# Patient Record
Sex: Male | Born: 1985 | Race: Black or African American | Hispanic: No | Marital: Married | Smoking: Never smoker
Health system: Southern US, Community
[De-identification: ages and names within clinical notes are randomized; demographics above are authoritative.]

## PROBLEM LIST (undated history)

## (undated) DIAGNOSIS — K449 Diaphragmatic hernia without obstruction or gangrene: Secondary | ICD-10-CM

## (undated) DIAGNOSIS — K219 Gastro-esophageal reflux disease without esophagitis: Secondary | ICD-10-CM

## (undated) DIAGNOSIS — S86019A Strain of unspecified Achilles tendon, initial encounter: Secondary | ICD-10-CM

## (undated) DIAGNOSIS — F419 Anxiety disorder, unspecified: Secondary | ICD-10-CM

## (undated) DIAGNOSIS — J302 Other seasonal allergic rhinitis: Secondary | ICD-10-CM

## (undated) HISTORY — DX: Gastro-esophageal reflux disease without esophagitis: K21.9

## (undated) HISTORY — DX: Diaphragmatic hernia without obstruction or gangrene: K44.9

## (undated) HISTORY — PX: UPPER GI ENDOSCOPY: SHX6162

---

## 2005-04-23 ENCOUNTER — Emergency Department (HOSPITAL_COMMUNITY): Admission: EM | Admit: 2005-04-23 | Discharge: 2005-04-23 | Payer: Self-pay | Admitting: Emergency Medicine

## 2005-04-30 ENCOUNTER — Emergency Department (HOSPITAL_COMMUNITY): Admission: EM | Admit: 2005-04-30 | Discharge: 2005-04-30 | Payer: Self-pay | Admitting: Emergency Medicine

## 2006-04-24 ENCOUNTER — Emergency Department (HOSPITAL_COMMUNITY): Admission: EM | Admit: 2006-04-24 | Discharge: 2006-04-24 | Payer: Self-pay | Admitting: Emergency Medicine

## 2006-04-28 ENCOUNTER — Emergency Department (HOSPITAL_COMMUNITY): Admission: EM | Admit: 2006-04-28 | Discharge: 2006-04-28 | Payer: Self-pay | Admitting: Emergency Medicine

## 2006-06-09 ENCOUNTER — Emergency Department (HOSPITAL_COMMUNITY): Admission: EM | Admit: 2006-06-09 | Discharge: 2006-06-09 | Payer: Self-pay | Admitting: Emergency Medicine

## 2006-06-20 ENCOUNTER — Emergency Department (HOSPITAL_COMMUNITY): Admission: EM | Admit: 2006-06-20 | Discharge: 2006-06-20 | Payer: Self-pay | Admitting: Emergency Medicine

## 2006-07-06 ENCOUNTER — Emergency Department (HOSPITAL_COMMUNITY): Admission: EM | Admit: 2006-07-06 | Discharge: 2006-07-06 | Payer: Self-pay | Admitting: Emergency Medicine

## 2006-07-10 ENCOUNTER — Ambulatory Visit: Payer: Self-pay | Admitting: Gastroenterology

## 2006-07-11 ENCOUNTER — Ambulatory Visit: Payer: Self-pay | Admitting: Gastroenterology

## 2011-01-20 ENCOUNTER — Inpatient Hospital Stay (INDEPENDENT_AMBULATORY_CARE_PROVIDER_SITE_OTHER)
Admission: RE | Admit: 2011-01-20 | Discharge: 2011-01-20 | Disposition: A | Discharge status: Home / Self Care | Payer: 59 | Source: Ambulatory Visit | Attending: Emergency Medicine | Admitting: Emergency Medicine

## 2011-01-20 DIAGNOSIS — Z760 Encounter for issue of repeat prescription: Secondary | ICD-10-CM

## 2011-04-03 ENCOUNTER — Emergency Department (HOSPITAL_COMMUNITY)
Admission: EM | Admit: 2011-04-03 | Discharge: 2011-04-03 | Disposition: A | Discharge status: Home / Self Care | Payer: 59 | Attending: Family Medicine | Admitting: Family Medicine

## 2011-04-03 DIAGNOSIS — R42 Dizziness and giddiness: Secondary | ICD-10-CM | POA: Insufficient documentation

## 2011-04-03 DIAGNOSIS — F410 Panic disorder [episodic paroxysmal anxiety] without agoraphobia: Secondary | ICD-10-CM | POA: Insufficient documentation

## 2011-04-03 DIAGNOSIS — R0989 Other specified symptoms and signs involving the circulatory and respiratory systems: Secondary | ICD-10-CM | POA: Insufficient documentation

## 2011-04-03 DIAGNOSIS — R5381 Other malaise: Secondary | ICD-10-CM | POA: Insufficient documentation

## 2011-04-03 DIAGNOSIS — R51 Headache: Secondary | ICD-10-CM | POA: Insufficient documentation

## 2011-04-03 DIAGNOSIS — R0609 Other forms of dyspnea: Secondary | ICD-10-CM | POA: Insufficient documentation

## 2011-04-03 DIAGNOSIS — M542 Cervicalgia: Secondary | ICD-10-CM | POA: Insufficient documentation

## 2011-04-03 LAB — POCT I-STAT, CHEM 8
BUN: 16 mg/dL (ref 6–23)
Calcium, Ion: 1.15 mmol/L (ref 1.12–1.32)
Chloride: 100 mEq/L (ref 96–112)
Creatinine, Ser: 1.1 mg/dL (ref 0.50–1.35)
Glucose, Bld: 93 mg/dL (ref 70–99)
HCT: 50 % (ref 39.0–52.0)

## 2011-05-30 ENCOUNTER — Encounter: Payer: Self-pay | Admitting: Emergency Medicine

## 2011-05-30 ENCOUNTER — Other Ambulatory Visit: Payer: Self-pay

## 2011-05-30 ENCOUNTER — Emergency Department (HOSPITAL_COMMUNITY)
Admission: EM | Admit: 2011-05-30 | Discharge: 2011-05-30 | Disposition: A | Discharge status: Home / Self Care | Payer: 59 | Attending: Emergency Medicine | Admitting: Emergency Medicine

## 2011-05-30 DIAGNOSIS — R209 Unspecified disturbances of skin sensation: Secondary | ICD-10-CM | POA: Insufficient documentation

## 2011-05-30 DIAGNOSIS — R079 Chest pain, unspecified: Secondary | ICD-10-CM | POA: Insufficient documentation

## 2011-05-30 DIAGNOSIS — F410 Panic disorder [episodic paroxysmal anxiety] without agoraphobia: Secondary | ICD-10-CM | POA: Insufficient documentation

## 2011-05-30 DIAGNOSIS — F419 Anxiety disorder, unspecified: Secondary | ICD-10-CM

## 2011-05-30 DIAGNOSIS — M542 Cervicalgia: Secondary | ICD-10-CM | POA: Insufficient documentation

## 2011-05-30 DIAGNOSIS — F411 Generalized anxiety disorder: Secondary | ICD-10-CM | POA: Insufficient documentation

## 2011-05-30 DIAGNOSIS — Z79899 Other long term (current) drug therapy: Secondary | ICD-10-CM | POA: Insufficient documentation

## 2011-05-30 MED ORDER — LORAZEPAM 1 MG PO TABS
1.0000 mg | ORAL_TABLET | Freq: Once | ORAL | Status: AC
  Administered 2011-05-30: 1 mg via ORAL
  Filled 2011-05-30: qty 1

## 2011-05-30 MED ORDER — ALPRAZOLAM 0.5 MG PO TABS: 0.5000 mg | ORAL_TABLET | Freq: Every evening | ORAL | Status: AC | PRN

## 2011-05-30 NOTE — ED Notes (Signed)
Pt states that on his way to work this am he began feeling sob, and anxiety, heavy breathing talking in full sentences, states that he feels like it is a panic attack has a hx of this

## 2011-05-30 NOTE — ED Provider Notes (Signed)
History     CSN: [id number] Arrival date & time: 05/30/2011  8:24 AM   First MD Initiated Contact with Patient 05/30/11 [phone]      Chief Complaint  Patient presents with  . Anxiety  . Neck Pain    (Consider location/radiation/quality/duration/timing/severity/associated sxs/prior treatment) Patient is a 25 y.o. male presenting with anxiety and neck pain. The history is provided by the patient. No language interpreter was used.  Anxiety This is a recurrent problem. The current episode started today. The problem occurs rarely. The problem has been unchanged. Associated symptoms include chest pain and neck pain. Pertinent negatives include no congestion or coughing. The symptoms are aggravated by stress. He has tried nothing for the symptoms.  Neck Pain  Associated symptoms include chest pain.   Reports that he was working on the rehab floor at [employer] and suddenly started to breath very fast and both his hands started tingling.  States that this happened once before about a month ago.  States that his Aunt died about 2 weeks ago and that is the stress in his life right now. Other than that he is not sure what could be causing these symptoms.  Feels pressure in his throat/chest and breathing fast.   Past Medical History  Diagnosis Date  . Panic attack     History reviewed. No pertinent past surgical history.  No family history on file.  History  Substance Use Topics  . Smoking status: Never Smoker   . Smokeless tobacco: Not on file  . Alcohol Use: Yes      Review of Systems  HENT: Positive for neck pain. Negative for congestion.   Respiratory: Negative for cough.   Cardiovascular: Positive for chest pain.  All other systems reviewed and are negative.    Allergies  Review of patient's allergies indicates no known allergies.  Home Medications   Current Outpatient Rx  Name Route Sig Dispense Refill  . ALBUTEROL SULFATE (2.5 MG/3ML) 0.083% IN NEBU Nebulization Take  2.5 mg by nebulization every 6 (six) hours as needed.      Marland Kitchen FLUTICASONE-SALMETEROL 115-21 MCG/ACT IN AERO Inhalation Inhale 2 puffs into the lungs 2 (two) times daily.      Marland Kitchen BARD DISPOZ-A-BAG/FLIP-FLO VLV MISC Does not apply by Does not apply route.      Marland Kitchen MONTELUKAST SODIUM 10 MG PO TABS Oral Take 10 mg by mouth at bedtime.        BP 128/71  Pulse 95  Temp(Src) 97.6 F (36.4 C) (Oral)  Resp 18  SpO2 100%  Physical Exam  Nursing note and vitals reviewed. Constitutional: He is oriented to person, place, and time. He appears well-developed and well-nourished.  HENT:  Head: Normocephalic and atraumatic.  Eyes: Pupils are equal, round, and reactive to light.  Neck: Normal range of motion. Neck supple.  Cardiovascular: Normal rate.  Exam reveals no gallop and no friction rub.   Pulmonary/Chest: Breath sounds normal.       hyperventilating  Abdominal: Soft.  Musculoskeletal: Normal range of motion.  Neurological: He is alert and oriented to person, place, and time.  Skin: Skin is warm and dry.  Psychiatric: His mood appears anxious. Thought content is not paranoid and not delusional. Cognition and memory are not impaired. He does not exhibit a depressed mood. He expresses no homicidal and no suicidal ideation. He expresses no suicidal plans and no homicidal plans. He exhibits normal recent memory and normal remote memory.       Anxious  ED Course  Procedures (including critical care time)  Labs Reviewed - No data to display No results found.   No diagnosis found.    MDM          Jethro Bastos, NP 05/30/11 [phone]

## 2011-06-01 NOTE — ED Provider Notes (Signed)
Medical screening examination/treatment/procedure(s) were performed by non-physician practitioner and as supervising physician I was immediately available for consultation/collaboration.   Gwyneth Sprout, MD 06/01/11 2141

## 2011-07-15 ENCOUNTER — Emergency Department (HOSPITAL_BASED_OUTPATIENT_CLINIC_OR_DEPARTMENT_OTHER)
Admission: EM | Admit: 2011-07-15 | Discharge: 2011-07-15 | Disposition: A | Discharge status: Home / Self Care | Payer: 59 | Attending: Emergency Medicine | Admitting: Emergency Medicine

## 2011-07-15 ENCOUNTER — Ambulatory Visit (INDEPENDENT_AMBULATORY_CARE_PROVIDER_SITE_OTHER): Payer: 59

## 2011-07-15 ENCOUNTER — Emergency Department (INDEPENDENT_AMBULATORY_CARE_PROVIDER_SITE_OTHER): Payer: 59

## 2011-07-15 ENCOUNTER — Encounter (HOSPITAL_BASED_OUTPATIENT_CLINIC_OR_DEPARTMENT_OTHER): Payer: Self-pay | Admitting: Emergency Medicine

## 2011-07-15 DIAGNOSIS — R51 Headache: Secondary | ICD-10-CM

## 2011-07-15 DIAGNOSIS — R0602 Shortness of breath: Secondary | ICD-10-CM

## 2011-07-15 DIAGNOSIS — J45909 Unspecified asthma, uncomplicated: Secondary | ICD-10-CM | POA: Insufficient documentation

## 2011-07-15 DIAGNOSIS — R110 Nausea: Secondary | ICD-10-CM

## 2011-07-15 MED ORDER — HYDROMORPHONE HCL PF 1 MG/ML IJ SOLN
1.0000 mg | Freq: Once | INTRAMUSCULAR | Status: AC
  Administered 2011-07-15: 1 mg via INTRAVENOUS
  Filled 2011-07-15: qty 1

## 2011-07-15 MED ORDER — SODIUM CHLORIDE 0.9 % IV BOLUS (SEPSIS)
1000.0000 mL | Freq: Once | INTRAVENOUS | Status: AC
  Administered 2011-07-15: 1000 mL via INTRAVENOUS

## 2011-07-15 MED ORDER — ONDANSETRON HCL 4 MG/2ML IJ SOLN
4.0000 mg | Freq: Once | INTRAMUSCULAR | Status: AC
  Administered 2011-07-15: 4 mg via INTRAVENOUS
  Filled 2011-07-15: qty 2

## 2011-07-15 MED ORDER — DIPHENHYDRAMINE HCL 50 MG/ML IJ SOLN
50.0000 mg | Freq: Once | INTRAMUSCULAR | Status: AC
  Administered 2011-07-15: 50 mg via INTRAVENOUS
  Filled 2011-07-15: qty 1

## 2011-07-15 MED ORDER — METHYLPREDNISOLONE SODIUM SUCC 125 MG IJ SOLR
125.0000 mg | Freq: Once | INTRAMUSCULAR | Status: AC
  Administered 2011-07-15: 125 mg via INTRAVENOUS
  Filled 2011-07-15: qty 2

## 2011-07-15 NOTE — ED Provider Notes (Signed)
History     CSN: [id number]  Arrival date & time 07/15/11  1230   First MD Initiated Contact with Patient 07/15/11 1314      Chief Complaint  Patient presents with  . Headache    Headache with nausea and SOB    (Consider location/radiation/quality/duration/timing/severity/associated sxs/prior treatment) HPI Comments: No h/o headaches.  Patient is a 25 y.o. male presenting with headaches. The history is provided by the patient. No language interpreter was used.  Headache  This is a new problem. Episode onset: ~ 1 week ago. The problem occurs constantly. The problem has not changed since onset.The headache is associated with bright light and loud noise. The pain is located in the temporal region. The pain is moderate. The pain does not radiate. Associated symptoms include nausea. Pertinent negatives include no anorexia, no fever, no malaise/fatigue, no near-syncope and no vomiting. He has tried nothing for the symptoms.    Past Medical History  Diagnosis Date  . Panic attack   . Asthma     Past Surgical History  Procedure Date  . Hiatal hernia repair     History reviewed. No pertinent family history.  History  Substance Use Topics  . Smoking status: Never Smoker   . Smokeless tobacco: Not on file  . Alcohol Use: Yes      Review of Systems  Constitutional: Negative for fever and malaise/fatigue.  Cardiovascular: Negative for near-syncope.  Gastrointestinal: Positive for nausea. Negative for vomiting and anorexia.  Neurological: Positive for headaches.    Allergies  Review of patient's allergies indicates no known allergies.  Home Medications   Current Outpatient Rx  Name Route Sig Dispense Refill  . SUMATRIPTAN SUCCINATE 100 MG PO TABS Oral Take 100 mg by mouth every 2 (two) hours as needed.      . ALBUTEROL SULFATE HFA 108 (90 BASE) MCG/ACT IN AERS Inhalation Inhale 2 puffs into the lungs every 6 (six) hours as needed. For shortness of breath     .  FLUTICASONE PROPIONATE 50 MCG/ACT NA SUSP Nasal Place 2 sprays into the nose daily.      Marland Kitchen FLUTICASONE-SALMETEROL 115-21 MCG/ACT IN AERO Inhalation Inhale 1 puff into the lungs 2 (two) times daily.     Marland Kitchen MONTELUKAST SODIUM 10 MG PO TABS Oral Take 10 mg by mouth at bedtime.       BP 131/67  Pulse 66  Temp 98 F (36.7 C)  Resp 20  SpO2 99%  Physical Exam  Nursing note and vitals reviewed. Constitutional: He is oriented to person, place, and time. He appears well-developed and well-nourished. He is cooperative. No distress.  HENT:  Head: Normocephalic and atraumatic. Head is without raccoon's eyes, without Battle's sign, without abrasion, without contusion and without laceration.    Eyes: EOM are normal. Pupils are equal, round, and reactive to light.  Neck: Normal range of motion. Neck supple.  Cardiovascular: Normal rate, regular rhythm, normal heart sounds and intact distal pulses.   Pulmonary/Chest: Effort normal and breath sounds normal. No respiratory distress. He has no wheezes. He has no rales. He exhibits no tenderness.  Abdominal: Soft. He exhibits no distension. There is no tenderness.  Musculoskeletal: Normal range of motion.  Lymphadenopathy:    He has no cervical adenopathy.  Neurological: He is alert and oriented to person, place, and time. He has normal strength. No cranial nerve deficit or sensory deficit. GCS eye subscore is 4. GCS verbal subscore is 5. GCS motor subscore is 6.  Skin: Skin  is warm and dry. He is not diaphoretic.  Psychiatric: He has a normal mood and affect. Judgment normal.    ED Course  Procedures (including critical care time)  Labs Reviewed - No data to display No results found.   No diagnosis found.    MDM          Worthy Rancher, PA 07/15/11 [phone]

## 2011-07-15 NOTE — ED Notes (Signed)
Pt bright pleasant pain controlled

## 2011-07-15 NOTE — ED Provider Notes (Signed)
Medical screening examination/treatment/procedure(s) were performed by non-physician practitioner and as supervising physician I was immediately available for consultation/collaboration.  Shelda Jakes, MD 07/15/11 2231

## 2011-07-15 NOTE — ED Notes (Signed)
Pt reports persistant headche and nausea x 2 weeks

## 2011-07-19 ENCOUNTER — Ambulatory Visit (INDEPENDENT_AMBULATORY_CARE_PROVIDER_SITE_OTHER): Payer: 59

## 2011-07-19 DIAGNOSIS — R51 Headache: Secondary | ICD-10-CM

## 2011-07-19 DIAGNOSIS — R110 Nausea: Secondary | ICD-10-CM

## 2011-08-02 ENCOUNTER — Encounter: Payer: Self-pay | Admitting: Physician Assistant

## 2011-08-02 ENCOUNTER — Telehealth: Payer: Self-pay | Admitting: *Deleted

## 2011-08-02 ENCOUNTER — Ambulatory Visit (INDEPENDENT_AMBULATORY_CARE_PROVIDER_SITE_OTHER): Payer: 59 | Admitting: Physician Assistant

## 2011-08-02 DIAGNOSIS — K219 Gastro-esophageal reflux disease without esophagitis: Secondary | ICD-10-CM

## 2011-08-02 DIAGNOSIS — F419 Anxiety disorder, unspecified: Secondary | ICD-10-CM

## 2011-08-02 DIAGNOSIS — J45909 Unspecified asthma, uncomplicated: Secondary | ICD-10-CM | POA: Insufficient documentation

## 2011-08-02 DIAGNOSIS — F411 Generalized anxiety disorder: Secondary | ICD-10-CM

## 2011-08-02 MED ORDER — ESOMEPRAZOLE MAGNESIUM 40 MG PO CPDR: DELAYED_RELEASE_CAPSULE | ORAL | Status: DC

## 2011-08-02 NOTE — Telephone Encounter (Signed)
Pt walked in asking to be seen today. Pt of Dr Jarold Motto who hasn't been seen since 2007 when he had an OV followed by an EGD showing HH. Today he reports his HH is bothering him; he took an OTC pantoprazole this am, but the reflux is really bothering him. Pt to see Mike Gip, PA at 0900am today.

## 2011-08-02 NOTE — Progress Notes (Signed)
Subjective:    Patient ID: Antonio Rose, male    DOB: February 18, 1986, 26 y.o.   MRN: [id number]  HPI Kaimani is a pleasant generally healthy 26 year old male with history of asthma, anxiety and GERD. He was seen by Dr. Jarold Motto several years ago and 2007 and at that time was having significant reflux symptoms manifested with chest pain epigastric pain and intermittent vomiting. He did undergo upper endoscopy at that time which showed a 2 cm prolapsing hiatal hernia and a somewhat irregular Z line. Biopsies were benign with no evidence of Barrett's. He was treated at that time with a month's worth of Protonix and his symptoms resolved. He says he has done well over the past couple of years but does have intermittent problems with heartburn and reflux which he thinks is probably stress related. He says over the past couple of months he had not been feeling well and was having problems with persistent headache which was making him anxious. He had undergone workup for that including an MRI which was negative and finally was found to have an abscessed tooth and had a root canal 2 days ago and says his headache is gone. Over the past month or so he is lost about 12 pounds his appetite has been decreased and he has had some nausea especially early in the mornings. He also relates a burning-type feeling in his stomach in chest which radiates into his mid back. He has no odynophagia but has had some very vague mild dysphagia over the past couple of weeks.  I has been taking an occasional ibuprofen for his headaches done on a daily basis. His bowels been moving normally has not had any melena or hematochezia. Has not had any recent antibiotics or other new medicines.  Previous upper abdominal ultrasound was unremarkable.  Review of Systems  Constitutional: Positive for unexpected weight change.  HENT: Negative.   Eyes: Negative.   Respiratory: Negative.   Cardiovascular: Negative.   Gastrointestinal: Positive for  abdominal pain.  Genitourinary: Negative.   Musculoskeletal: Negative.   Skin: Negative.   Neurological: Negative.   Hematological: Negative.   Psychiatric/Behavioral: The patient is nervous/anxious.    Outpatient Prescriptions Prior to Visit  Medication Sig Dispense Refill  . albuterol (PROVENTIL HFA;VENTOLIN HFA) 108 (90 BASE) MCG/ACT inhaler Inhale 2 puffs into the lungs every 6 (six) hours as needed. For shortness of breath       . fluticasone (FLONASE) 50 MCG/ACT nasal spray Place 2 sprays into the nose daily.        . fluticasone-salmeterol (ADVAIR HFA) 115-21 MCG/ACT inhaler Inhale 1 puff into the lungs 2 (two) times daily.       . montelukast (SINGULAIR) 10 MG tablet Take 10 mg by mouth at bedtime.       . SUMAtriptan (IMITREX) 100 MG tablet Take 100 mg by mouth every 2 (two) hours as needed.         No Known Allergies     Objective:   Physical Exam well-developed young African American male healthy-appearing in no acute distress, pleasant HEENT; nontraumatic, normocephalic, EOMI PERRLA sclera anicteric,Neck; Supple no JVD, Cardiovascular; regular rate and rhythm with S1-S2 no murmur rub or gallop, Pulmonary; clear bilaterally, Abdomen; soft, mildly tender in the epigastrium no guarding, no ,no palpable mass or hepatosplenomegaly, Rectal; not done, Extremities; no clubbing cyanosis or edema skin warm and dry, Psych; mood and affect appropriate.        Assessment & Plan:  #51 26 year old male with history  of GERD, presenting with 4-6 week history of epigastric pain burning in his chest and nausea, all exacerbated by several week history of headache which has since resolved since returned now. I suspect he has had an exacerbation of his acid reflux disease- may have gastritis and esophagitis at this time.  Plan; Reassurance Start Nexium 40 mg by mouth every morning, patient was given samples. He will be treated for 4 weeks and at that time is asked to stop the medication if his  symptoms recur he may require long-term acid suppression, and was asked to call back for followup at that point.  Discussed  antireflux regimen and he was given an antireflux diet as well.

## 2011-08-02 NOTE — Patient Instructions (Signed)
We have given you samples of Nexium 40 mg. Take 1 capsule in the AM daily.  When you have finished the samples you can try Prilosec 20 mg OTC.   Call us back if symptoms reoccur.   Stop the Ibuprofen and anti-inflammatories.  We have given you Reflux brochures.

## 2011-08-02 NOTE — Progress Notes (Signed)
Reviewed and agree with management. Malcolm T. Stark MD FACG 

## 2011-08-25 ENCOUNTER — Ambulatory Visit (INDEPENDENT_AMBULATORY_CARE_PROVIDER_SITE_OTHER): Payer: 59 | Admitting: Internal Medicine

## 2011-08-25 ENCOUNTER — Encounter: Payer: Self-pay | Admitting: Internal Medicine

## 2011-08-25 ENCOUNTER — Other Ambulatory Visit (INDEPENDENT_AMBULATORY_CARE_PROVIDER_SITE_OTHER): Payer: 59

## 2011-08-25 VITALS — BP 118/72 | HR 74 | Temp 98.2°F | Resp 16 | Ht 75.0 in | Wt 207.0 lb

## 2011-08-25 DIAGNOSIS — F32A Depression, unspecified: Secondary | ICD-10-CM | POA: Insufficient documentation

## 2011-08-25 DIAGNOSIS — F329 Major depressive disorder, single episode, unspecified: Secondary | ICD-10-CM

## 2011-08-25 DIAGNOSIS — Z0000 Encounter for general adult medical examination without abnormal findings: Secondary | ICD-10-CM

## 2011-08-25 LAB — CBC WITH DIFFERENTIAL/PLATELET
Basophils Absolute: 0 10*3/uL (ref 0.0–0.1)
Basophils Relative: 0.4 % (ref 0.0–3.0)
Eosinophils Absolute: 0.1 10*3/uL (ref 0.0–0.7)
Eosinophils Relative: 2.2 % (ref 0.0–5.0)
HCT: 43.1 % (ref 39.0–52.0)
Hemoglobin: 14.6 g/dL (ref 13.0–17.0)
Lymphocytes Relative: 30.3 % (ref 12.0–46.0)
Lymphs Abs: 1.6 10*3/uL (ref 0.7–4.0)
MCHC: 33.8 g/dL (ref 30.0–36.0)
MCV: 89.5 fl (ref 78.0–100.0)
Monocytes Absolute: 0.5 10*3/uL (ref 0.1–1.0)
Monocytes Relative: 9.7 % (ref 3.0–12.0)
Platelets: 257 10*3/uL (ref 150.0–400.0)
RBC: 4.82 Mil/uL (ref 4.22–5.81)
RDW: 13.8 % (ref 11.5–14.6)

## 2011-08-25 LAB — COMPREHENSIVE METABOLIC PANEL
ALT: 18 U/L (ref 0–53)
AST: 23 U/L (ref 0–37)
Albumin: 4.5 g/dL (ref 3.5–5.2)
Alkaline Phosphatase: 38 U/L — ABNORMAL LOW (ref 39–117)
BUN: 14 mg/dL (ref 6–23)
CO2: 26 mEq/L (ref 19–32)
Calcium: 9.3 mg/dL (ref 8.4–10.5)
Chloride: 104 mEq/L (ref 96–112)
Creatinine, Ser: 1 mg/dL (ref 0.4–1.5)
Glucose, Bld: 91 mg/dL (ref 70–99)
Potassium: 4 mEq/L (ref 3.5–5.1)
Sodium: 137 mEq/L (ref 135–145)
Total Bilirubin: 0.4 mg/dL (ref 0.3–1.2)
Total Protein: 7.6 g/dL (ref 6.0–8.3)

## 2011-08-25 LAB — LIPID PANEL
Cholesterol: 195 mg/dL (ref 0–200)
LDL Cholesterol: 143 mg/dL — ABNORMAL HIGH (ref 0–99)
Total CHOL/HDL Ratio: 4

## 2011-08-25 MED ORDER — DULOXETINE HCL 30 MG PO CPEP: 30.0000 mg | ORAL_CAPSULE | Freq: Every day | ORAL | Status: DC

## 2011-08-25 NOTE — Progress Notes (Signed)
  Subjective:    Patient ID: Antonio Rose, male    DOB: 04/05/1986, 26 y.o.   MRN: [id number]  HPI New to me for a complete physical but he also complains of several stressful events over the last year with ill family members and a nephew that died and he has developed panic attacks, sleep disturbance, fatigue, frequent headaches, and anxiety.   Review of Systems  Constitutional: Negative.   HENT: Negative.   Eyes: Negative.   Respiratory: Negative.   Cardiovascular: Negative.   Gastrointestinal: Negative.   Genitourinary: Negative.   Musculoskeletal: Negative.   Skin: Negative.   Neurological: Negative.   Hematological: Negative.   Psychiatric/Behavioral: Positive for sleep disturbance (FA's) and dysphoric mood. Negative for suicidal ideas, hallucinations, behavioral problems, confusion, self-injury, decreased concentration and agitation. The patient is nervous/anxious. The patient is not hyperactive.        Objective:   Physical Exam  Vitals reviewed. Constitutional: He is oriented to person, place, and time. He appears well-developed and well-nourished. No distress.  HENT:  Head: Normocephalic and atraumatic.  Mouth/Throat: Oropharynx is clear and moist. No oropharyngeal exudate.  Eyes: Conjunctivae are normal. Right eye exhibits no discharge. Left eye exhibits no discharge. No scleral icterus.  Neck: Normal range of motion. Neck supple. No JVD present. No tracheal deviation present. No thyromegaly present.  Cardiovascular: Normal rate, regular rhythm, normal heart sounds and intact distal pulses.  Exam reveals no gallop and no friction rub.   No murmur heard. Pulmonary/Chest: Effort normal and breath sounds normal. No stridor. No respiratory distress. He has no wheezes. He has no rales. He exhibits no tenderness.  Abdominal: Soft. Bowel sounds are normal. He exhibits no distension and no mass. There is no tenderness. There is no rebound and no guarding. Hernia confirmed negative  in the right inguinal area and confirmed negative in the left inguinal area.  Genitourinary: Testes normal and penis normal. Right testis shows no mass, no swelling and no tenderness. Right testis is descended. Left testis shows no mass, no swelling and no tenderness. Left testis is descended. Circumcised. No penile tenderness. No discharge found.  Musculoskeletal: Normal range of motion. He exhibits no edema and no tenderness.  Lymphadenopathy:    He has no cervical adenopathy.       Right: No inguinal adenopathy present.       Left: No inguinal adenopathy present.  Neurological: He is oriented to person, place, and time.  Skin: Skin is warm and dry. No rash noted. He is not diaphoretic. No erythema. No pallor.  Psychiatric: He has a normal mood and affect. His behavior is normal. Judgment and thought content normal.      Lab Results  Component Value Date   HGB 17.0 04/03/2011   HCT 50.0 04/03/2011   GLUCOSE 93 04/03/2011   NA 138 04/03/2011   K 3.7 04/03/2011   CL 100 04/03/2011   CREATININE 1.10 04/03/2011   BUN 16 04/03/2011      Assessment & Plan:

## 2011-08-25 NOTE — Patient Instructions (Signed)
Health Maintenance, Males A healthy lifestyle and preventative care can promote health and wellness.  Maintain regular health, dental, and eye exams.   Eat a healthy diet. Foods like vegetables, fruits, whole grains, low-fat dairy products, and lean protein foods contain the nutrients you need without too many calories. Decrease your intake of foods high in solid fats, added sugars, and salt. Get information about a proper diet from your caregiver, if necessary.   Regular physical exercise is one of the most important things you can do for your health. Most adults should get at least 150 minutes of moderate-intensity exercise (any activity that increases your heart rate and causes you to sweat) each week. In addition, most adults need muscle-strengthening exercises on 2 or more days a week.    Maintain a healthy weight. The body mass index (BMI) is a screening tool to identify possible weight problems. It provides an estimate of body fat based on height and weight. Your caregiver can help determine your BMI, and can help you achieve or maintain a healthy weight. For adults 20 years and older:   A BMI below 18.5 is considered underweight.   A BMI of 18.5 to 24.9 is normal.   A BMI of 25 to 29.9 is considered overweight.   A BMI of 30 and above is considered obese.   Maintain normal blood lipids and cholesterol by exercising and minimizing your intake of saturated fat. Eat a balanced diet with plenty of fruits and vegetables. Blood tests for lipids and cholesterol should begin at age 20 and be repeated every 5 years. If your lipid or cholesterol levels are high, you are over 50, or you are a high risk for heart disease, you may need your cholesterol levels checked more frequently.Ongoing high lipid and cholesterol levels should be treated with medicines, if diet and exercise are not effective.   If you smoke, find out from your caregiver how to quit. If you do not use tobacco, do not start.    If you choose to drink alcohol, do not exceed 2 drinks per day. One drink is considered to be 12 ounces (355 mL) of beer, 5 ounces (148 mL) of wine, or 1.5 ounces (44 mL) of liquor.   Avoid use of street drugs. Do not share needles with anyone. Ask for help if you need support or instructions about stopping the use of drugs.   High blood pressure causes heart disease and increases the risk of stroke. Blood pressure should be checked at least every 1 to 2 years. Ongoing high blood pressure should be treated with medicines if weight loss and exercise are not effective.   If you are 45 to 27 years old, ask your caregiver if you should take aspirin to prevent heart disease.   Diabetes screening involves taking a blood sample to check your fasting blood sugar level. This should be done once every 3 years, after age 45, if you are within normal weight and without risk factors for diabetes. Testing should be considered at a younger age or be carried out more frequently if you are overweight and have at least 1 risk factor for diabetes.   Colorectal cancer can be detected and often prevented. Most routine colorectal cancer screening begins at the age of 50 and continues through age 75. However, your caregiver may recommend screening at an earlier age if you have risk factors for colon cancer. On a yearly basis, your caregiver may provide home test kits to check for hidden   blood in the stool. Use of a small camera at the end of a tube, to directly examine the colon (sigmoidoscopy or colonoscopy), can detect the earliest forms of colorectal cancer. Talk to your caregiver about this at age 50, when routine screening begins. Direct examination of the colon should be repeated every 5 to 10 years through age 75, unless early forms of pre-cancerous polyps or small growths are found.   Healthy men should no longer receive prostate-specific antigen (PSA) blood tests as part of routine cancer screening. Consult with  your caregiver about prostate cancer screening.   Practice safe sex. Use condoms and avoid high-risk sexual practices to reduce the spread of sexually transmitted infections (STIs).   Use sunscreen with a sun protection factor (SPF) of 30 or greater. Apply sunscreen liberally and repeatedly throughout the day. You should seek shade when your shadow is shorter than you. Protect yourself by wearing long sleeves, pants, a wide-brimmed hat, and sunglasses year round, whenever you are outdoors.   Notify your caregiver of new moles or changes in moles, especially if there is a change in shape or color. Also notify your caregiver if a mole is larger than the size of a pencil eraser.   A one-time screening for abdominal aortic aneurysm (AAA) and surgical repair of large AAAs by sound wave imaging (ultrasonography) is recommended for ages 65 to 75 years who are current or former smokers.   Stay current with your immunizations.  Document Released: 01/06/2008 Document Revised: 03/22/2011 Document Reviewed: 12/05/2010 [employer] Patient Information 2012 [employer]. 

## 2011-08-25 NOTE — Assessment & Plan Note (Signed)
Exam done, labs ordered, pt ed material as given 

## 2011-08-25 NOTE — Assessment & Plan Note (Signed)
-  Start cymbalta

## 2011-08-27 ENCOUNTER — Encounter: Payer: Self-pay | Admitting: Internal Medicine

## 2012-01-08 ENCOUNTER — Telehealth: Payer: Self-pay | Admitting: *Deleted

## 2012-01-08 MED ORDER — BECLOMETHASONE DIPROPIONATE 40 MCG/ACT IN AERS: 1.0000 | INHALATION_SPRAY | Freq: Two times a day (BID) | RESPIRATORY_TRACT | Status: DC

## 2012-01-08 NOTE — Telephone Encounter (Signed)
Pharmacy called and asked if they can change pt Flovent to Qvar?  Chart is at nurses station for review

## 2012-01-08 NOTE — Telephone Encounter (Signed)
Ok to change to Qvar 40 mcg 1 puff bid #1 RF 3

## 2012-04-01 ENCOUNTER — Other Ambulatory Visit: Payer: Self-pay | Admitting: Emergency Medicine

## 2012-06-22 ENCOUNTER — Ambulatory Visit (INDEPENDENT_AMBULATORY_CARE_PROVIDER_SITE_OTHER): Payer: 59 | Admitting: Family Medicine

## 2012-06-22 ENCOUNTER — Ambulatory Visit: Payer: 59

## 2012-06-22 VITALS — BP 134/76 | HR 92 | Temp 98.8°F | Resp 17 | Ht 73.0 in | Wt 207.0 lb

## 2012-06-22 DIAGNOSIS — S86019A Strain of unspecified Achilles tendon, initial encounter: Secondary | ICD-10-CM

## 2012-06-22 DIAGNOSIS — M79606 Pain in leg, unspecified: Secondary | ICD-10-CM

## 2012-06-22 DIAGNOSIS — M7660 Achilles tendinitis, unspecified leg: Secondary | ICD-10-CM

## 2012-06-22 DIAGNOSIS — M6789 Other specified disorders of synovium and tendon, multiple sites: Secondary | ICD-10-CM

## 2012-06-22 DIAGNOSIS — M25579 Pain in unspecified ankle and joints of unspecified foot: Secondary | ICD-10-CM

## 2012-06-22 DIAGNOSIS — M79609 Pain in unspecified limb: Secondary | ICD-10-CM

## 2012-06-22 HISTORY — DX: Strain of unspecified achilles tendon, initial encounter: S86.019A

## 2012-06-22 MED ORDER — TRAMADOL HCL 50 MG PO TABS: 50.0000 mg | ORAL_TABLET | Freq: Three times a day (TID) | ORAL | Status: DC | PRN

## 2012-06-22 MED ORDER — NAPROXEN 500 MG PO TABS: 500.0000 mg | ORAL_TABLET | Freq: Two times a day (BID) | ORAL | Status: DC

## 2012-06-22 NOTE — Progress Notes (Signed)
[facility]:  Office Visit  Chief Complaint:  Chief Complaint  Patient presents with  . Leg Pain    felt a pop while playing basketball     HPI: Antonio Rose is a 26 y.o. male who complains of  Left ankle and leg pain s/p fall from basketball. Was hit from behind by another player. + pain, swelling, some numbness/tingling on bottom toes of left foot.    Past Medical History  Diagnosis Date  . Panic attack   . Asthma   . Hiatal hernia   . Environmental allergies   . Allergy   . GERD (gastroesophageal reflux disease)    Past Surgical History  Procedure Date  . Hiatal hernia repair   . Dental surgery    History   Social History  . Marital Status: Married    Spouse Name: N/A    Number of Children: N/A  . Years of Education: N/A   Social History Main Topics  . Smoking status: Never Smoker   . Smokeless tobacco: None  . Alcohol Use: No  . Drug Use: No  . Sexually Active: Yes    Birth Control/ Protection: Condom   Other Topics Concern  . None   Social History Narrative  . None   Family History  Problem Relation Age of Onset  . Heart disease    . Cancer Neg Hx   . Diabetes Neg Hx   . Depression Neg Hx   . Hypertension Neg Hx   . Asthma Father    No Known Allergies Prior to Admission medications   Medication Sig Start Date End Date Taking? Authorizing Provider  albuterol (PROVENTIL HFA;VENTOLIN HFA) 108 (90 BASE) MCG/ACT inhaler Inhale 2 puffs into the lungs every 6 (six) hours as needed. For shortness of breath    Yes Historical Provider, MD  fluticasone (FLONASE) 50 MCG/ACT nasal spray INSTILL 2 SPRAYS INTO EACH NOSTRIL TWICE A DAY 04/01/12  Yes Ryan M Dunn, PA-C  fluticasone-salmeterol (ADVAIR HFA) 115-21 MCG/ACT inhaler Inhale 1 puff into the lungs 2 (two) times daily.    Yes Historical Provider, MD  montelukast (SINGULAIR) 10 MG tablet Take 10 mg by mouth at bedtime.    Yes Historical Provider, MD  ALPRAZolam Prudy Feeler) 0.5 MG tablet Take  0.5 mg by mouth at bedtime as needed.    Historical Provider, MD  beclomethasone (QVAR) 40 MCG/ACT inhaler Inhale 1 puff into the lungs 2 (two) times daily. 01/08/12 01/07/13  Raymon Mutton Dunn, PA-C  DULoxetine (CYMBALTA) 30 MG capsule Take 1 capsule (30 mg total) by mouth daily. 08/25/11 08/24/12  Etta Grandchild, MD  esomeprazole (NEXIUM) 40 MG capsule Take 1 capsule in the AM. 08/02/11   Amy S Esterwood, PA  ibuprofen (ADVIL,MOTRIN) 800 MG tablet Take 800 mg by mouth every 8 (eight) hours as needed.    Historical Provider, MD  ondansetron (ZOFRAN) 4 MG tablet Take 4 mg by mouth every 8 (eight) hours as needed.    Historical Provider, MD     ROS: The patient denies fevers, chills, night sweats, unintentional weight loss, chest pain, palpitations, wheezing, dyspnea on exertion, nausea, vomiting, abdominal pain, dysuria, hematuria, melena, numbness, weakness, or tingling.   All other systems have been reviewed and were otherwise negative with the exception of those mentioned in the HPI and as above.    PHYSICAL EXAM: Filed Vitals:   06/22/12 1107  BP: 134/76  Pulse: 92  Temp: 98.8 F (37.1 C)  Resp: 17  Filed Vitals:   06/22/12 1107  Height: 6\' 1"  (1.854 m)  Weight: 207 lb (93.895 kg)   Body mass index is 27.31 kg/(m^2).  General: Alert, no acute distress HEENT:  Normocephalic, atraumatic, oropharynx patent.  Cardiovascular:  Regular rate and rhythm, no rubs murmurs or gallops.  No Carotid bruits, radial pulse intact. No pedal edema.  Respiratory: Clear to auscultation bilaterally.  No wheezes, rales, or rhonchi.  No cyanosis, no use of accessory musculature GI: No organomegaly, abdomen is soft and non-tender, positive bowel sounds.  No masses. Skin: No rashes. Neurologic: Facial musculature symmetric. Psychiatric: Patient is appropriate throughout our interaction. Lymphatic: No cervical lymphadenopathy Musculoskeletal: Gait limping, using crutches + swelling, tenderness on left achilles  tendon going up to mid calf, there is no gap that I can palpate + ROM  Pain with dorsi and plantar flexion Did not do Thomas test   LABS: Results for orders placed in visit on 08/25/11  CBC WITH DIFFERENTIAL      Component Value Range   WBC 5.4  4.5 - 10.5 K/uL   RBC 4.82  4.22 - 5.81 Mil/uL   Hemoglobin 14.6  13.0 - 17.0 g/dL   HCT 16.1  09.6 - 04.5 %   MCV 89.5  78.0 - 100.0 fl   MCHC 33.8  30.0 - 36.0 g/dL   RDW 40.9  81.1 - 91.4 %   Platelets 257.0  150.0 - 400.0 K/uL   Neutrophils Relative 57.4  43.0 - 77.0 %   Lymphocytes Relative 30.3  12.0 - 46.0 %   Monocytes Relative 9.7  3.0 - 12.0 %   Eosinophils Relative 2.2  0.0 - 5.0 %   Basophils Relative 0.4  0.0 - 3.0 %   Neutro Abs 3.1  1.4 - 7.7 K/uL   Lymphs Abs 1.6  0.7 - 4.0 K/uL   Monocytes Absolute 0.5  0.1 - 1.0 K/uL   Eosinophils Absolute 0.1  0.0 - 0.7 K/uL   Basophils Absolute 0.0  0.0 - 0.1 K/uL  LIPID PANEL      Component Value Range   Cholesterol 195  0 - 200 mg/dL   Triglycerides 78.2  0.0 - 149.0 mg/dL   HDL 95.62  >13.08 mg/dL   VLDL 7.2  0.0 - 65.7 mg/dL   LDL Cholesterol 846 (*) 0 - 99 mg/dL   Total CHOL/HDL Ratio 4    COMPREHENSIVE METABOLIC PANEL      Component Value Range   Sodium 137  135 - 145 mEq/L   Potassium 4.0  3.5 - 5.1 mEq/L   Chloride 104  96 - 112 mEq/L   CO2 26  19 - 32 mEq/L   Glucose, Bld 91  70 - 99 mg/dL   BUN 14  6 - 23 mg/dL   Creatinine, Ser 1.0  0.4 - 1.5 mg/dL   Total Bilirubin 0.4  0.3 - 1.2 mg/dL   Alkaline Phosphatase 38 (*) 39 - 117 U/L   AST 23  0 - 37 U/L   ALT 18  0 - 53 U/L   Total Protein 7.6  6.0 - 8.3 g/dL   Albumin 4.5  3.5 - 5.2 g/dL   Calcium 9.3  8.4 - 96.2 mg/dL   GFR 952.84  >13.24 mL/min  TSH      Component Value Range   TSH 1.11  0.35 - 5.50 uIU/mL     EKG/XRAY:   Primary read interpreted by Dr. Conley Rolls at [facility]. No fractures or dislocation + non  acute, chronic ossicle off posterior talus + soft tissue swelling    ASSESSMENT/PLAN: Encounter  Diagnoses  Name Primary?  Marland Kitchen Ankle pain Yes  . Leg pain    ? Achilles tendon tear vs gastroc muscle tear.  Patient was given cam walker, advise to get heel lift Advise to [facility] Rx Tramadol for pain Work note given Will refer to [employer] , either Drs Nani Skillern or Charlann Boxer at patient's request.     Antanette Richwine PHUONG, DO 06/22/2012 11:29 AM

## 2012-06-25 ENCOUNTER — Encounter (HOSPITAL_BASED_OUTPATIENT_CLINIC_OR_DEPARTMENT_OTHER): Payer: Self-pay | Admitting: *Deleted

## 2012-06-26 ENCOUNTER — Encounter (HOSPITAL_BASED_OUTPATIENT_CLINIC_OR_DEPARTMENT_OTHER): Payer: Self-pay | Admitting: Certified Registered"

## 2012-06-26 ENCOUNTER — Ambulatory Visit (HOSPITAL_BASED_OUTPATIENT_CLINIC_OR_DEPARTMENT_OTHER)
Admission: RE | Admit: 2012-06-26 | Discharge: 2012-06-26 | Disposition: A | Discharge status: Home / Self Care | Payer: 59 | Source: Ambulatory Visit | Attending: Orthopedic Surgery | Admitting: Orthopedic Surgery

## 2012-06-26 ENCOUNTER — Ambulatory Visit (HOSPITAL_BASED_OUTPATIENT_CLINIC_OR_DEPARTMENT_OTHER): Payer: 59 | Admitting: Certified Registered"

## 2012-06-26 ENCOUNTER — Encounter (HOSPITAL_BASED_OUTPATIENT_CLINIC_OR_DEPARTMENT_OTHER): Payer: Self-pay | Admitting: *Deleted

## 2012-06-26 ENCOUNTER — Encounter (HOSPITAL_BASED_OUTPATIENT_CLINIC_OR_DEPARTMENT_OTHER)
Admission: RE | Disposition: A | Discharge status: Home / Self Care | Payer: Self-pay | Source: Ambulatory Visit | Attending: Orthopedic Surgery

## 2012-06-26 DIAGNOSIS — Y929 Unspecified place or not applicable: Secondary | ICD-10-CM | POA: Insufficient documentation

## 2012-06-26 DIAGNOSIS — S86012A Strain of left Achilles tendon, initial encounter: Secondary | ICD-10-CM

## 2012-06-26 DIAGNOSIS — X58XXXA Exposure to other specified factors, initial encounter: Secondary | ICD-10-CM | POA: Insufficient documentation

## 2012-06-26 DIAGNOSIS — Y9367 Activity, basketball: Secondary | ICD-10-CM | POA: Insufficient documentation

## 2012-06-26 DIAGNOSIS — K219 Gastro-esophageal reflux disease without esophagitis: Secondary | ICD-10-CM | POA: Insufficient documentation

## 2012-06-26 DIAGNOSIS — S93499A Sprain of other ligament of unspecified ankle, initial encounter: Secondary | ICD-10-CM | POA: Insufficient documentation

## 2012-06-26 DIAGNOSIS — J45909 Unspecified asthma, uncomplicated: Secondary | ICD-10-CM | POA: Insufficient documentation

## 2012-06-26 DIAGNOSIS — S96819A Strain of other specified muscles and tendons at ankle and foot level, unspecified foot, initial encounter: Secondary | ICD-10-CM | POA: Insufficient documentation

## 2012-06-26 HISTORY — DX: Anxiety disorder, unspecified: F41.9

## 2012-06-26 HISTORY — DX: Strain of unspecified achilles tendon, initial encounter: S86.019A

## 2012-06-26 HISTORY — DX: Other seasonal allergic rhinitis: J30.2

## 2012-06-26 HISTORY — PX: ACHILLES TENDON SURGERY: SHX542

## 2012-06-26 SURGERY — REPAIR, TENDON, ACHILLES
Anesthesia: Regional | Site: Ankle | Laterality: Left | Wound class: Clean

## 2012-06-26 MED ORDER — PROPOFOL 10 MG/ML IV BOLUS
INTRAVENOUS | Status: DC | PRN
  Administered 2012-06-26: 200 mg via INTRAVENOUS

## 2012-06-26 MED ORDER — LACTATED RINGERS IV SOLN
INTRAVENOUS | Status: DC
  Administered 2012-06-26 (×2): via INTRAVENOUS

## 2012-06-26 MED ORDER — MIDAZOLAM HCL 2 MG/2ML IJ SOLN
1.0000 mg | INTRAMUSCULAR | Status: DC | PRN
  Administered 2012-06-26: 2 mg via INTRAVENOUS

## 2012-06-26 MED ORDER — CEFAZOLIN SODIUM-DEXTROSE 2-3 GM-% IV SOLR
2.0000 g | INTRAVENOUS | Status: AC
  Administered 2012-06-26: 2 g via INTRAVENOUS

## 2012-06-26 MED ORDER — FENTANYL CITRATE 0.05 MG/ML IJ SOLN
50.0000 ug | INTRAMUSCULAR | Status: DC | PRN
  Administered 2012-06-26: 100 ug via INTRAVENOUS

## 2012-06-26 MED ORDER — BUPIVACAINE-EPINEPHRINE PF 0.5-1:200000 % IJ SOLN
INTRAMUSCULAR | Status: DC | PRN
  Administered 2012-06-26: 30 mL

## 2012-06-26 MED ORDER — VITAMIN C 500 MG PO TABS: 500.0000 mg | ORAL_TABLET | Freq: Every day | ORAL | Status: DC

## 2012-06-26 MED ORDER — OXYCODONE-ACETAMINOPHEN 5-325 MG PO TABS
1.0000 | ORAL_TABLET | ORAL | Status: DC | PRN
Start: 2012-06-26 — End: 2013-01-30

## 2012-06-26 MED ORDER — FENTANYL CITRATE 0.05 MG/ML IJ SOLN
INTRAMUSCULAR | Status: DC | PRN
  Administered 2012-06-26: 25 ug via INTRAVENOUS

## 2012-06-26 MED ORDER — HYDROMORPHONE HCL PF 1 MG/ML IJ SOLN: 0.2500 mg | INTRAMUSCULAR | Status: DC | PRN

## 2012-06-26 MED ORDER — ONDANSETRON HCL 4 MG/2ML IJ SOLN
INTRAMUSCULAR | Status: DC | PRN
  Administered 2012-06-26: 4 mg via INTRAVENOUS

## 2012-06-26 MED ORDER — MIDAZOLAM HCL 5 MG/5ML IJ SOLN
INTRAMUSCULAR | Status: DC | PRN
  Administered 2012-06-26: 0.5 mg via INTRAVENOUS

## 2012-06-26 MED ORDER — CHLORHEXIDINE GLUCONATE 4 % EX LIQD: 60.0000 mL | Freq: Once | CUTANEOUS | Status: DC

## 2012-06-26 MED ORDER — OXYCODONE HCL 5 MG PO TABS
5.0000 mg | ORAL_TABLET | Freq: Once | ORAL | Status: DC | PRN
Start: 2012-06-26 — End: 2012-06-26

## 2012-06-26 MED ORDER — ASPIRIN EC 325 MG PO TBEC: 325.0000 mg | DELAYED_RELEASE_TABLET | Freq: Two times a day (BID) | ORAL | Status: DC

## 2012-06-26 MED ORDER — OXYCODONE HCL 5 MG/5ML PO SOLN: 5.0000 mg | Freq: Once | ORAL | Status: DC | PRN

## 2012-06-26 MED ORDER — SODIUM CHLORIDE 0.9 % IV SOLN: INTRAVENOUS | Status: DC

## 2012-06-26 MED ORDER — LIDOCAINE HCL (CARDIAC) 20 MG/ML IV SOLN
INTRAVENOUS | Status: DC | PRN
  Administered 2012-06-26: 20 mg via INTRAVENOUS

## 2012-06-26 MED ORDER — BUPIVACAINE HCL (PF) 0.5 % IJ SOLN
INTRAMUSCULAR | Status: DC | PRN
  Administered 2012-06-26: 10 mL

## 2012-06-26 MED ORDER — METHOCARBAMOL 500 MG PO TABS: 500.0000 mg | ORAL_TABLET | Freq: Three times a day (TID) | ORAL | Status: DC

## 2012-06-26 MED ORDER — DEXAMETHASONE SODIUM PHOSPHATE 4 MG/ML IJ SOLN
INTRAMUSCULAR | Status: DC | PRN
  Administered 2012-06-26: 10 mg via INTRAVENOUS

## 2012-06-26 SURGICAL SUPPLY — 72 items
APL SKNCLS STERI-STRIP NONHPOA (GAUZE/BANDAGES/DRESSINGS)
BANDAGE ELASTIC 4 VELCRO ST LF (GAUZE/BANDAGES/DRESSINGS) ×1 IMPLANT
BANDAGE ELASTIC 6 VELCRO ST LF (GAUZE/BANDAGES/DRESSINGS) ×1 IMPLANT
BENZOIN TINCTURE PRP APPL 2/3 (GAUZE/BANDAGES/DRESSINGS) IMPLANT
BLADE OSC/SAG .038X5.5 CUT EDG (BLADE) IMPLANT
BLADE SURG 11 STRL SS (BLADE) IMPLANT
BLADE SURG 15 STRL LF DISP TIS (BLADE) ×4 IMPLANT
BLADE SURG 15 STRL SS (BLADE) ×6
BRUSH SCRUB EZ PLAIN DRY (MISCELLANEOUS) ×2 IMPLANT
CANISTER SUCTION 1200CC (MISCELLANEOUS) ×2 IMPLANT
CLOTH BEACON ORANGE TIMEOUT ST (SAFETY) ×2 IMPLANT
COTTON STERILE ROLL (GAUZE/BANDAGES/DRESSINGS) ×1 IMPLANT
COVER TABLE BACK 60X90 (DRAPES) ×2 IMPLANT
CUFF TOURNIQUET SINGLE 34IN LL (TOURNIQUET CUFF) ×1 IMPLANT
DRAPE EXTREMITY T 121X128X90 (DRAPE) ×2 IMPLANT
DRAPE OEC MINIVIEW 54X84 (DRAPES) IMPLANT
DRAPE SURG 17X23 STRL (DRAPES) ×2 IMPLANT
DRSG PAD ABDOMINAL 8X10 ST (GAUZE/BANDAGES/DRESSINGS) ×1 IMPLANT
DURA STEPPER LG (CAST SUPPLIES) IMPLANT
DURA STEPPER MED (CAST SUPPLIES) IMPLANT
DURA STEPPER SML (CAST SUPPLIES) IMPLANT
ELECT REM PT RETURN 9FT ADLT (ELECTROSURGICAL) ×2
ELECTRODE REM PT RTRN 9FT ADLT (ELECTROSURGICAL) ×1 IMPLANT
GAUZE SPONGE 4X4 16PLY XRAY LF (GAUZE/BANDAGES/DRESSINGS) IMPLANT
GAUZE XEROFORM 1X8 LF (GAUZE/BANDAGES/DRESSINGS) IMPLANT
GAUZE XEROFORM 5X9 LF (GAUZE/BANDAGES/DRESSINGS) IMPLANT
GLOVE BIO SURGEON STRL SZ 6.5 (GLOVE) ×1 IMPLANT
GLOVE BIO SURGEON STRL SZ8 (GLOVE) ×2 IMPLANT
GLOVE BIOGEL PI IND STRL 8 (GLOVE) ×2 IMPLANT
GLOVE BIOGEL PI INDICATOR 8 (GLOVE) ×2
GLOVE INDICATOR 7.0 STRL GRN (GLOVE) ×1 IMPLANT
GLOVE SURG SS PI 8.0 STRL IVOR (GLOVE) ×2 IMPLANT
GOWN BRE IMP PREV XXLGXLNG (GOWN DISPOSABLE) ×2 IMPLANT
GOWN PREVENTION PLUS XLARGE (GOWN DISPOSABLE) ×2 IMPLANT
NDL SUT 6 .5 CRC .975X.05 MAYO (NEEDLE) IMPLANT
NEEDLE HYPO 22GX1.5 SAFETY (NEEDLE) IMPLANT
NEEDLE MAYO TAPER (NEEDLE)
PACK BASIN DAY SURGERY FS (CUSTOM PROCEDURE TRAY) ×2 IMPLANT
PAD CAST 4YDX4 CTTN HI CHSV (CAST SUPPLIES) ×1 IMPLANT
PADDING CAST ABS 4INX4YD NS (CAST SUPPLIES) ×1
PADDING CAST ABS COTTON 4X4 ST (CAST SUPPLIES) ×1 IMPLANT
PADDING CAST COTTON 4X4 STRL (CAST SUPPLIES) ×2
PASSER SUT SWANSON 36MM LOOP (INSTRUMENTS) IMPLANT
PENCIL BUTTON HOLSTER BLD 10FT (ELECTRODE) ×2 IMPLANT
SHEET MEDIUM DRAPE 40X70 STRL (DRAPES) ×4 IMPLANT
SLEEVE SCD COMPRESS KNEE MED (MISCELLANEOUS) ×1 IMPLANT
SPLINT FAST PLASTER 5X30 (CAST SUPPLIES) ×20
SPLINT PLASTER CAST FAST 5X30 (CAST SUPPLIES) ×20 IMPLANT
SPONGE GAUZE 4X4 12PLY (GAUZE/BANDAGES/DRESSINGS) ×2 IMPLANT
STOCKINETTE 6  STRL (DRAPES) ×1
STOCKINETTE 6 STRL (DRAPES) ×1 IMPLANT
STRIP CLOSURE SKIN 1/2X4 (GAUZE/BANDAGES/DRESSINGS) IMPLANT
SUCTION FRAZIER TIP 10 FR DISP (SUCTIONS) IMPLANT
SUT ETHILON 4 0 PS 2 18 (SUTURE) ×3 IMPLANT
SUT FIBERWIRE #2 38 T-5 BLUE (SUTURE) ×4
SUT FIBERWIRE #5 38 CONV NDL (SUTURE)
SUT FIBERWIRE 2-0 18 17.9 3/8 (SUTURE)
SUT MNCRL AB 4-0 PS2 18 (SUTURE) ×2 IMPLANT
SUT PDS AB 4-0 P3 18 (SUTURE) ×1 IMPLANT
SUT VIC AB 0 CT3 27 (SUTURE) IMPLANT
SUT VIC AB 2-0 PS2 27 (SUTURE) IMPLANT
SUT VIC AB 3-0 PS1 18 (SUTURE) ×4
SUT VIC AB 3-0 PS1 18XBRD (SUTURE) ×2 IMPLANT
SUTURE FIBERWR #2 38 T-5 BLUE (SUTURE) IMPLANT
SUTURE FIBERWR #5 38 CONV NDL (SUTURE) IMPLANT
SUTURE FIBERWR 2-0 18 17.9 3/8 (SUTURE) IMPLANT
SYR BULB 3OZ (MISCELLANEOUS) ×2 IMPLANT
SYR CONTROL 10ML LL (SYRINGE) IMPLANT
TOWEL OR NON WOVEN STRL DISP B (DISPOSABLE) ×2 IMPLANT
TUBE CONNECTING 20X1/4 (TUBING) ×4 IMPLANT
UNDERPAD 30X30 INCONTINENT (UNDERPADS AND DIAPERS) ×2 IMPLANT
YANKAUER SUCT BULB TIP NO VENT (SUCTIONS) ×2 IMPLANT

## 2012-06-26 NOTE — Progress Notes (Signed)
Assisted Dr. Fitzgerald with left, ultrasound guided, popliteal/saphenous block. Side rails up, monitors on throughout procedure. See vital signs in flow sheet. Tolerated Procedure well. 

## 2012-06-26 NOTE — Anesthesia Procedure Notes (Addendum)
Anesthesia Regional Block:  Popliteal block  Pre-Anesthetic Checklist: ,, timeout performed, Correct Patient, Correct Site, Correct Laterality, Correct Procedure, Correct Position, site marked, Risks and benefits discussed, pre-op evaluation, post-op pain management  Laterality: Left  Prep: Maximum Sterile Barrier Precautions used and chloraprep       Needles:  Injection technique: Single-shot  Needle Type: Echogenic Stimulator Needle          Additional Needles:  Procedures: ultrasound guided (picture in chart) and nerve stimulator Popliteal block  Nerve Stimulator or Paresthesia:  Response: Peroneal, 0.4 mA,  Response: Tibial, 0.4 mA,   Additional Responses:   Narrative:  Start time: 06/26/2012 2:54 PM End time: 06/26/2012 3:01 PM Injection made incrementally with aspirations every 5 mL. Anesthesiologist: Sampson Goon, MD  Additional Notes: 2% Lidocaine skin wheel. U/S guided saphenous block above the knee with 10cc of 0.5% Bupivicaine plain.  Popliteal block Procedure Name: LMA Insertion Date/Time: 06/26/2012 3:29 PM Performed by: Verlan Friends Pre-anesthesia Checklist: Patient identified, Emergency Drugs available, Suction available, Patient being monitored and Timeout performed Patient Re-evaluated:Patient Re-evaluated prior to inductionOxygen Delivery Method: Circle System Utilized Preoxygenation: Pre-oxygenation with 100% oxygen Intubation Type: IV induction Ventilation: Mask ventilation without difficulty LMA: LMA inserted LMA Size: 5.0 Number of attempts: 1 Airway Equipment and Method: bite block Placement Confirmation: positive ETCO2 Tube secured with: Tape Dental Injury: Teeth and Oropharynx as per pre-operative assessment

## 2012-06-26 NOTE — Transfer of Care (Signed)
Immediate Anesthesia Transfer of Care Note  Patient: Antonio Rose  Procedure(s) Performed: Procedure(s) (LRB) with comments: ACHILLES TENDON REPAIR (Left) - Left Primary Repair Achilles Tendon  Patient Location: PACU  Anesthesia Type:GA combined with regional for post-op pain  Level of Consciousness: awake, alert , oriented and patient cooperative  Airway & Oxygen Therapy: Patient Spontanous Breathing and Patient connected to face mask oxygen  Post-op Assessment: Report given to PACU RN and Post -op Vital signs reviewed and stable  Post vital signs: Reviewed and stable  Complications: No apparent anesthesia complications

## 2012-06-26 NOTE — Anesthesia Postprocedure Evaluation (Signed)
  Anesthesia Post-op Note  Patient: Antonio Rose  Procedure(s) Performed: Procedure(s) (LRB) with comments: ACHILLES TENDON REPAIR (Left) - Left Primary Repair Achilles Tendon  Patient Location: PACU  Anesthesia Type:GA combined with regional for post-op pain  Level of Consciousness: awake  Airway and Oxygen Therapy: Patient Spontanous Breathing and Patient connected to face mask oxygen  Post-op Pain: none  Post-op Assessment: Post-op Vital signs reviewed, Patient's Cardiovascular Status Stable, Respiratory Function Stable, Patent Airway and No signs of Nausea or vomiting  Post-op Vital Signs: Reviewed and stable  Complications: No apparent anesthesia complications

## 2012-06-26 NOTE — H&P (Signed)
  H&P documentation: Placed to be scanned history and physical exam in chart.  -History and Physical Reviewed  -Patient has been re-examined  -No change in the plan of care  Antonio Rose  

## 2012-06-26 NOTE — Anesthesia Preprocedure Evaluation (Signed)
Anesthesia Evaluation  Patient identified by MRN, date of birth, ID band Patient awake    Reviewed: Allergy & Precautions, H&P , NPO status , Patient's Chart, lab work & pertinent test results  Airway Mallampati: I TM Distance: >3 FB Neck ROM: Full    Dental No notable dental hx. (+) Teeth Intact and Dental Advisory Given   Pulmonary asthma ,  breath sounds clear to auscultation  Pulmonary exam normal       Cardiovascular negative cardio ROS  Rhythm:Regular Rate:Normal     Neuro/Psych negative neurological ROS  negative psych ROS   GI/Hepatic Neg liver ROS, GERD-  Controlled,  Endo/Other  negative endocrine ROS  Renal/GU negative Renal ROS  negative genitourinary   Musculoskeletal   Abdominal   Peds  Hematology negative hematology ROS (+)   Anesthesia Other Findings   Reproductive/Obstetrics negative OB ROS                           Anesthesia Physical Anesthesia Plan  ASA: II  Anesthesia Plan: General and Regional   Post-op Pain Management:    Induction: Intravenous  Airway Management Planned: LMA  Additional Equipment:   Intra-op Plan:   Post-operative Plan: Extubation in OR  Informed Consent: I have reviewed the patients History and Physical, chart, labs and discussed the procedure including the risks, benefits and alternatives for the proposed anesthesia with the patient or authorized representative who has indicated his/her understanding and acceptance.   Dental advisory given  Plan Discussed with: CRNA  Anesthesia Plan Comments:         Anesthesia Quick Evaluation

## 2012-06-26 NOTE — Brief Op Note (Signed)
06/26/2012  4:31 PM  PATIENT:  Antonio Rose  27 y.o. male  PRE-OPERATIVE DIAGNOSIS:  Left Achilles Tendon Rupture  POST-OPERATIVE DIAGNOSIS:  Left Achilles Tendon Rupture  PROCEDURE:  Procedure(s) (LRB) with comments: ACHILLES TENDON REPAIR (Left) - Left Primary Repair Achilles Tendon  SURGEON:  Surgeon(s) and Role:    * Sherri Rad, MD - Primary  PHYSICIAN ASSISTANT: Rexene Edison, PAC   ASSISTANTS: Rexene Edison, [facility]    ANESTHESIA:   general  EBL:  Total I/O In: 1400 [I.V.:1400] Out: -   BLOOD ADMINISTERED:none  DRAINS: none   LOCAL MEDICATIONS USED:  NONE  SPECIMEN:  No Specimen  DISPOSITION OF SPECIMEN:  N/A  COUNTS:  YES  TOURNIQUET:  * No tourniquets in log *  DICTATION: .Other Dictation: Dictation Number [id number]  PLAN OF CARE: Discharge to home after PACU  PATIENT DISPOSITION:  PACU - hemodynamically stable.   Delay start of Pharmacological VTE agent (>24hrs) due to surgical blood loss or risk of bleeding: no

## 2012-06-27 ENCOUNTER — Encounter (HOSPITAL_BASED_OUTPATIENT_CLINIC_OR_DEPARTMENT_OTHER): Payer: Self-pay | Admitting: Orthopedic Surgery

## 2012-06-28 NOTE — Op Note (Signed)
Antonio Rose, VASCO NO.:  [id number]  MEDICAL RECORD NO.:  [id number]  LOCATION:                                 FACILITY:  PHYSICIAN:  Leonides Grills, M.D.          DATE OF BIRTH:  DATE OF PROCEDURE:  06/26/2012 DATE OF DISCHARGE:                              OPERATIVE REPORT   PREOPERATIVE DIAGNOSIS:  Left Achilles tendon rupture.  POSTOPERATIVE DIAGNOSIS:  Left Achilles tendon rupture.  OPERATION: 1. Primary repair of left Achilles tendon rupture. 2. Left plantaris to Achilles tendon transfer.  ANESTHESIA:  General.  SURGEON:  Leonides Grills, M.D.  ASSISTANT:  Richardean Canal, PA-C  ESTIMATED BLOOD LOSS:  Minimal.  TOURNIQUET:  None.  COMPLICATIONS:  None.  DISPOSITION:  Stable to PR.  INDICATION:  This is a 26 year old gentleman who was playing basketball over the weekend, sustained the above injury.  He was consented for the above procedure.  All risks, infection, vessel injury, re-rupture of Achilles tendon, contracture, weakness, persistent pain, worse pain, prolonged recovery, wound healing problems, DVT, PE were all explained. Questions were encouraged and answered.  DESCRIPTION OF OPERATION:  The patient was brought to the operating room and placed in supine position after adequate general anesthesia was administered with Ancef 1 g IV piggyback as well as popliteal block.  He was then placed in the full lateral decubitus position with the operative side down on the beanbag.  All bony prominences were well padded.  Axial roll was placed.  Left lower extremity was prepped and draped in a sterile manner.  No tourniquet was used.  A longitudinal incision on the anteromedial aspect left Achilles tendon was then made. Dissection was carried down through skin.  Hemostasis was obtained. Fascia was opened in line with the incision.  The plantaris tendon was identified as well as the Achilles tendon rupture.  The rupture was approximately 5 cm  above the insertion in the calcaneus.  We then intended on incorporating the plantaris into the Achilles tendon repair. We then placed a modified Bunnell stitch using #2 FiberWire into the distal stump of the Achilles tendon and did a modified Krackow stitch with #2 FiberWire into the proximal stump of the Achilles tendon.  Then, with the ankle in maximum clonus, we then brought the 2 ends together and sewed this together.  This had an Conservation officer, historic buildings.  We then tucked in the loose mopped ends circumferentially using a 4-0 PDS stitch.  We then transferred the plantaris to Achilles tendon using 3-0 PDS stitch.  This had an Conservation officer, historic buildings.  There was no pulsatile bleeding.  The wound was copiously irrigated with normal saline.  Subcu was closed with 3-0 PDS.  Skin was closed with 4-0 nylon.  Sterile dressing was applied.  Modified Jones dressing was applied with the ankle in gravity equinus.  The patient was stable to the PR.     Leonides Grills, M.D.     PB/MEDQ  D:  06/26/2012  T:  06/27/2012  Job:  [id number]

## 2012-10-23 ENCOUNTER — Other Ambulatory Visit: Payer: Self-pay | Admitting: Radiology

## 2012-11-11 ENCOUNTER — Other Ambulatory Visit: Payer: Self-pay | Admitting: *Deleted

## 2012-11-11 MED ORDER — MONTELUKAST SODIUM 10 MG PO TABS: 10.0000 mg | ORAL_TABLET | Freq: Every day | ORAL | Status: DC

## 2012-11-11 MED ORDER — ALBUTEROL SULFATE HFA 108 (90 BASE) MCG/ACT IN AERS: 2.0000 | INHALATION_SPRAY | Freq: Four times a day (QID) | RESPIRATORY_TRACT | Status: DC | PRN

## 2013-01-30 ENCOUNTER — Ambulatory Visit (INDEPENDENT_AMBULATORY_CARE_PROVIDER_SITE_OTHER): Payer: 59 | Admitting: Internal Medicine

## 2013-01-30 ENCOUNTER — Other Ambulatory Visit (INDEPENDENT_AMBULATORY_CARE_PROVIDER_SITE_OTHER): Payer: 59

## 2013-01-30 ENCOUNTER — Encounter: Payer: 59 | Admitting: Internal Medicine

## 2013-01-30 ENCOUNTER — Encounter: Payer: Self-pay | Admitting: Internal Medicine

## 2013-01-30 ENCOUNTER — Other Ambulatory Visit: Payer: Self-pay | Admitting: Internal Medicine

## 2013-01-30 VITALS — BP 120/66 | HR 96 | Temp 98.2°F | Resp 16 | Ht 74.0 in | Wt 217.0 lb

## 2013-01-30 DIAGNOSIS — J4520 Mild intermittent asthma, uncomplicated: Secondary | ICD-10-CM

## 2013-01-30 DIAGNOSIS — J309 Allergic rhinitis, unspecified: Secondary | ICD-10-CM | POA: Insufficient documentation

## 2013-01-30 DIAGNOSIS — K219 Gastro-esophageal reflux disease without esophagitis: Secondary | ICD-10-CM

## 2013-01-30 DIAGNOSIS — Z23 Encounter for immunization: Secondary | ICD-10-CM

## 2013-01-30 DIAGNOSIS — Z0000 Encounter for general adult medical examination without abnormal findings: Secondary | ICD-10-CM

## 2013-01-30 LAB — LDL CHOLESTEROL, DIRECT: Direct LDL: 149.2 mg/dL

## 2013-01-30 LAB — LIPID PANEL
Total CHOL/HDL Ratio: 4
Triglycerides: 71 mg/dL (ref 0.0–149.0)
VLDL: 14.2 mg/dL (ref 0.0–40.0)

## 2013-01-30 MED ORDER — FLUTICASONE-SALMETEROL 115-21 MCG/ACT IN AERO: 1.0000 | INHALATION_SPRAY | Freq: Two times a day (BID) | RESPIRATORY_TRACT | Status: DC

## 2013-01-30 MED ORDER — FLUTICASONE PROPIONATE 50 MCG/ACT NA SUSP: 4.0000 | Freq: Every day | NASAL | Status: DC

## 2013-01-30 MED ORDER — ALBUTEROL SULFATE HFA 108 (90 BASE) MCG/ACT IN AERS: 2.0000 | INHALATION_SPRAY | Freq: Four times a day (QID) | RESPIRATORY_TRACT | Status: DC | PRN

## 2013-01-30 NOTE — Progress Notes (Signed)
  Subjective:    Patient ID: Antonio Rose, male    DOB: [birth year], 27 y.o.   MRN: [id number]  HPI  He returns for a physical - he feels well and offers no complaints.  Review of Systems  All other systems reviewed and are negative.       Objective:   Physical Exam  Vitals reviewed. Constitutional: He is oriented to person, place, and time. He appears well-developed and well-nourished. No distress.  HENT:  Head: Normocephalic and atraumatic.  Mouth/Throat: Oropharynx is clear and moist. No oropharyngeal exudate.  Eyes: Conjunctivae are normal. Right eye exhibits no discharge. Left eye exhibits no discharge. No scleral icterus.  Neck: Normal range of motion. Neck supple. No JVD present. No tracheal deviation present. No thyromegaly present.  Cardiovascular: Normal rate, regular rhythm, normal heart sounds and intact distal pulses.  Exam reveals no gallop and no friction rub.   No murmur heard. Pulmonary/Chest: Effort normal and breath sounds normal. No stridor. No respiratory distress. He has no wheezes. He has no rales. He exhibits no tenderness.  Abdominal: Soft. Bowel sounds are normal. He exhibits no distension and no mass. There is no tenderness. There is no rebound and no guarding. Hernia confirmed negative in the right inguinal area and confirmed negative in the left inguinal area.  Genitourinary: Testes normal and penis normal. Right testis shows no mass, no swelling and no tenderness. Right testis is descended. Left testis shows no mass, no swelling and no tenderness. Left testis is descended. Circumcised. No penile erythema or penile tenderness. No discharge found.  Musculoskeletal: Normal range of motion. He exhibits no edema and no tenderness.  Lymphadenopathy:    He has no cervical adenopathy.       Right: No inguinal adenopathy present.       Left: No inguinal adenopathy present.  Neurological: He is oriented to person, place, and time.  Skin: Skin is warm and dry. No  rash noted. He is not diaphoretic. No erythema. No pallor.  Psychiatric: He has a normal mood and affect. His behavior is normal. Judgment and thought content normal.          Assessment & Plan:

## 2013-01-30 NOTE — Assessment & Plan Note (Signed)
Exam done Vaccines were reviewed Labs ordered Pt ed material was given 

## 2013-01-30 NOTE — Patient Instructions (Signed)
Health Maintenance, Males A healthy lifestyle and preventative care can promote health and wellness.  Maintain regular health, dental, and eye exams.  Eat a healthy diet. Foods like vegetables, fruits, whole grains, low-fat dairy products, and lean protein foods contain the nutrients you need without too many calories. Decrease your intake of foods high in solid fats, added sugars, and salt. Get information about a proper diet from your caregiver, if necessary.  Regular physical exercise is one of the most important things you can do for your health. Most adults should get at least 150 minutes of moderate-intensity exercise (any activity that increases your heart rate and causes you to sweat) each week. In addition, most adults need muscle-strengthening exercises on 2 or more days a week.   Maintain a healthy weight. The body mass index (BMI) is a screening tool to identify possible weight problems. It provides an estimate of body fat based on height and weight. Your caregiver can help determine your BMI, and can help you achieve or maintain a healthy weight. For adults 20 years and older:  A BMI below 18.5 is considered underweight.  A BMI of 18.5 to 24.9 is normal.  A BMI of 25 to 29.9 is considered overweight.  A BMI of 30 and above is considered obese.  Maintain normal blood lipids and cholesterol by exercising and minimizing your intake of saturated fat. Eat a balanced diet with plenty of fruits and vegetables. Blood tests for lipids and cholesterol should begin at age 20 and be repeated every 5 years. If your lipid or cholesterol levels are high, you are over 50, or you are a high risk for heart disease, you may need your cholesterol levels checked more frequently.Ongoing high lipid and cholesterol levels should be treated with medicines, if diet and exercise are not effective.  If you smoke, find out from your caregiver how to quit. If you do not use tobacco, do not start.  If you  choose to drink alcohol, do not exceed 2 drinks per day. One drink is considered to be 12 ounces (355 mL) of beer, 5 ounces (148 mL) of wine, or 1.5 ounces (44 mL) of liquor.  Avoid use of street drugs. Do not share needles with anyone. Ask for help if you need support or instructions about stopping the use of drugs.  High blood pressure causes heart disease and increases the risk of stroke. Blood pressure should be checked at least every 1 to 2 years. Ongoing high blood pressure should be treated with medicines if weight loss and exercise are not effective.  If you are 45 to 28 years old, ask your caregiver if you should take aspirin to prevent heart disease.  Diabetes screening involves taking a blood sample to check your fasting blood sugar level. This should be done once every 3 years, after age 45, if you are within normal weight and without risk factors for diabetes. Testing should be considered at a younger age or be carried out more frequently if you are overweight and have at least 1 risk factor for diabetes.  Colorectal cancer can be detected and often prevented. Most routine colorectal cancer screening begins at the age of 50 and continues through age 75. However, your caregiver may recommend screening at an earlier age if you have risk factors for colon cancer. On a yearly basis, your caregiver may provide home test kits to check for hidden blood in the stool. Use of a small camera at the end of a tube,   to directly examine the colon (sigmoidoscopy or colonoscopy), can detect the earliest forms of colorectal cancer. Talk to your caregiver about this at age 50, when routine screening begins. Direct examination of the colon should be repeated every 5 to 10 years through age 75, unless early forms of pre-cancerous polyps or small growths are found.  Hepatitis C blood testing is recommended for all people born from 1945 through 1965 and any individual with known risks for hepatitis C.  Healthy  men should no longer receive prostate-specific antigen (PSA) blood tests as part of routine cancer screening. Consult with your caregiver about prostate cancer screening.  Testicular cancer screening is not recommended for adolescents or adult males who have no symptoms. Screening includes self-exam, caregiver exam, and other screening tests. Consult with your caregiver about any symptoms you have or any concerns you have about testicular cancer.  Practice safe sex. Use condoms and avoid high-risk sexual practices to reduce the spread of sexually transmitted infections (STIs).  Use sunscreen with a sun protection factor (SPF) of 30 or greater. Apply sunscreen liberally and repeatedly throughout the day. You should seek shade when your shadow is shorter than you. Protect yourself by wearing long sleeves, pants, a wide-brimmed hat, and sunglasses year round, whenever you are outdoors.  Notify your caregiver of new moles or changes in moles, especially if there is a change in shape or color. Also notify your caregiver if a mole is larger than the size of a pencil eraser.  A one-time screening for abdominal aortic aneurysm (AAA) and surgical repair of large AAAs by sound wave imaging (ultrasonography) is recommended for ages 65 to 75 years who are current or former smokers.  Stay current with your immunizations. Document Released: 01/06/2008 Document Revised: 10/02/2011 Document Reviewed: 12/05/2010 [employer] Patient Information 2014 [employer].  

## 2013-01-31 ENCOUNTER — Encounter: Payer: Self-pay | Admitting: Internal Medicine

## 2013-02-26 ENCOUNTER — Encounter (HOSPITAL_COMMUNITY): Payer: Self-pay

## 2013-02-26 ENCOUNTER — Emergency Department (HOSPITAL_COMMUNITY): Payer: 59

## 2013-02-26 ENCOUNTER — Emergency Department (HOSPITAL_COMMUNITY)
Admission: EM | Admit: 2013-02-26 | Discharge: 2013-02-26 | Disposition: A | Discharge status: Home / Self Care | Payer: 59 | Attending: Emergency Medicine | Admitting: Emergency Medicine

## 2013-02-26 DIAGNOSIS — K219 Gastro-esophageal reflux disease without esophagitis: Secondary | ICD-10-CM | POA: Insufficient documentation

## 2013-02-26 DIAGNOSIS — Z8719 Personal history of other diseases of the digestive system: Secondary | ICD-10-CM | POA: Insufficient documentation

## 2013-02-26 DIAGNOSIS — Z79899 Other long term (current) drug therapy: Secondary | ICD-10-CM | POA: Insufficient documentation

## 2013-02-26 DIAGNOSIS — Z8739 Personal history of other diseases of the musculoskeletal system and connective tissue: Secondary | ICD-10-CM | POA: Insufficient documentation

## 2013-02-26 DIAGNOSIS — R42 Dizziness and giddiness: Secondary | ICD-10-CM | POA: Insufficient documentation

## 2013-02-26 DIAGNOSIS — IMO0002 Reserved for concepts with insufficient information to code with codable children: Secondary | ICD-10-CM | POA: Insufficient documentation

## 2013-02-26 DIAGNOSIS — R0789 Other chest pain: Secondary | ICD-10-CM | POA: Insufficient documentation

## 2013-02-26 DIAGNOSIS — Z8659 Personal history of other mental and behavioral disorders: Secondary | ICD-10-CM | POA: Insufficient documentation

## 2013-02-26 DIAGNOSIS — R002 Palpitations: Secondary | ICD-10-CM | POA: Insufficient documentation

## 2013-02-26 DIAGNOSIS — J45901 Unspecified asthma with (acute) exacerbation: Secondary | ICD-10-CM | POA: Insufficient documentation

## 2013-02-26 LAB — CBC
Hemoglobin: 14.3 g/dL (ref 13.0–17.0)
MCH: 29.5 pg (ref 26.0–34.0)
MCHC: 34.5 g/dL (ref 30.0–36.0)
Platelets: 250 10*3/uL (ref 150–400)
RBC: 4.85 MIL/uL (ref 4.22–5.81)

## 2013-02-26 LAB — BASIC METABOLIC PANEL
Calcium: 9.5 mg/dL (ref 8.4–10.5)
GFR calc non Af Amer: >90 mL/min (ref >90)
Glucose, Bld: 108 mg/dL — ABNORMAL HIGH (ref 70–99)
Potassium: 3.7 mEq/L (ref 3.5–5.1)
Sodium: 134 mEq/L — ABNORMAL LOW (ref 135–145)

## 2013-02-26 LAB — POCT I-STAT TROPONIN I: Troponin i, poc: 0.01 ng/mL (ref 0.00–0.08)

## 2013-02-26 MED ORDER — GI COCKTAIL ~~LOC~~
30.0000 mL | Freq: Once | ORAL | Status: AC
  Administered 2013-02-26: 30 mL via ORAL
  Filled 2013-02-26: qty 30

## 2013-02-26 MED ORDER — FAMOTIDINE 40 MG PO TABS: 20.0000 mg | ORAL_TABLET | Freq: Every day | ORAL | Status: DC

## 2013-02-26 MED ORDER — FAMOTIDINE 20 MG PO TABS
20.0000 mg | ORAL_TABLET | Freq: Once | ORAL | Status: AC
  Administered 2013-02-26: 20 mg via ORAL
  Filled 2013-02-26: qty 1

## 2013-02-26 NOTE — ED Notes (Signed)
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

## 2013-02-26 NOTE — ED Provider Notes (Signed)
CSN: [id number]     Arrival date & time 02/26/13  1454 History     None    Chief Complaint  Patient presents with  . Chest Pain  . Shortness of Breath   (Consider location/radiation/quality/duration/timing/severity/associated sxs/prior Treatment) HPI  Antonio Rose is a 27 y.o. male complaining of lightheaded sensation with palpitations described as irregular heartbeat associated with shortness of breath lasting for approximately 2 minutes resolved spontaneously now states he has a little bit of chest discomfort, rated at 1/10 in the left lower anterior chest, nonradiating. Patient has family history of early cardiac death, he has never been a smoker, no hypertension or high cholesterol. He is otherwise healthy except for anxiety and hiatal hernia with GERD. Patient has not been taking any antacids. He states that he has had a very stressful few months with death of a close family member. He has had similar episodes over the last few weeks. No prior history of DVT, PE, recent immobilizations.   Past Medical History  Diagnosis Date  . Hiatal hernia   . GERD (gastroesophageal reflux disease)   . Anxiety     no current med.  . Seasonal allergies   . Asthma     prn inhalers  . Achilles tendon rupture 06/22/2012    left   Past Surgical History  Procedure Laterality Date  . Upper gi endoscopy      had anesthesia for procedure  . Achilles tendon surgery  06/26/2012    Procedure: ACHILLES TENDON REPAIR;  Surgeon: Sherri Rad, MD;  Location: [facility];  Service: Orthopedics;  Laterality: Left;  Left Primary Repair Achilles Tendon   Family History  Problem Relation Age of Onset  . Asthma Father    History  Substance Use Topics  . Smoking status: Never Smoker   . Smokeless tobacco: Never Used  . Alcohol Use: No     Comment: occasionally    Review of Systems 10 systems reviewed and found to be negative, except as noted in the HPI   Allergies  Review of  patient's allergies indicates no known allergies.  Home Medications   Current Outpatient Rx  Name  Route  Sig  Dispense  Refill  . albuterol (PROVENTIL HFA;VENTOLIN HFA) 108 (90 BASE) MCG/ACT inhaler   Inhalation   Inhale 2 puffs into the lungs every 6 (six) hours as needed. For shortness of breath   18 g   11     Needs OV for further refills.   . fluticasone (FLONASE) 50 MCG/ACT nasal spray   Nasal   Place 4 sprays into the nose daily.   16 g   11   . fluticasone-salmeterol (ADVAIR HFA) 115-21 MCG/ACT inhaler   Inhalation   Inhale 1 puff into the lungs 2 (two) times daily.   1 Inhaler   11   . montelukast (SINGULAIR) 10 MG tablet      TAKE 1 TABLET (10 MG TOTAL) BY MOUTH AT BEDTIME. NEED OFFICE VISIT FOR FURTHER REFILLS.   90 tablet   3   . Multiple Vitamin (MULTIVITAMIN) tablet   Oral   Take 1 tablet by mouth daily.         . ranitidine (ZANTAC) 150 MG tablet   Oral   Take 150 mg by mouth 2 (two) times daily.         . vitamin C (ASCORBIC ACID) 500 MG tablet   Oral   Take 1 tablet (500 mg total) by mouth daily.  90 tablet   0    BP 129/71  Pulse 88  Temp(Src) 98.8 F (37.1 C) (Oral)  SpO2 98% Physical Exam  Nursing note and vitals reviewed. Constitutional: He is oriented to person, place, and time. He appears well-developed and well-nourished. No distress.  HENT:  Head: Normocephalic.  Mouth/Throat: Oropharynx is clear and moist.  Eyes: Conjunctivae and EOM are normal. Pupils are equal, round, and reactive to light.  Neck: Normal range of motion. No JVD present. No tracheal deviation present. No thyromegaly present.  Cardiovascular: Normal rate, regular rhythm and intact distal pulses.   Pulmonary/Chest: Effort normal and breath sounds normal. No stridor. No respiratory distress. He has no wheezes. He has no rales. He exhibits no tenderness.  Abdominal: Soft. Bowel sounds are normal. He exhibits no distension and no mass. There is no tenderness.  There is no rebound and no guarding.  Musculoskeletal: Normal range of motion. He exhibits no edema.  Neurological: He is alert and oriented to person, place, and time.  Psychiatric: He has a normal mood and affect.    ED Course   Procedures (including critical care time)  Labs Reviewed  BASIC METABOLIC PANEL - Abnormal; Notable for the following:    Sodium 134 (*)    Glucose, Bld 108 (*)    All other components within normal limits  CBC  POCT I-STAT TROPONIN I   Dg Chest 2 View  02/26/2013   *RADIOLOGY REPORT*  Clinical Data: Chest pain, shortness of breath  CHEST - 2 VIEW  Comparison: 06/20/2006  Findings: Lungs are clear. No pleural effusion or pneumothorax.  Cardiomediastinal silhouette is within normal limits.  Visualized osseous structures are within normal limits.  IMPRESSION: Normal chest radiographs.   Original Report Authenticated By: Charline Bills, M.D.    Date: 02/26/2013  Rate: 73  Rhythm: normal sinus rhythm  QRS Axis: normal  Intervals: normal  ST/T Wave abnormalities: normal  Conduction Disutrbances:none  Narrative Interpretation:   Old EKG Reviewed: None available   1. Atypical chest pain     MDM   Filed Vitals:   02/26/13 1506  BP: 129/71  Pulse: 88  Temp: 98.8 F (37.1 C)  TempSrc: Oral  SpO2: 98%     Antonio Rose is a 27 y.o. male with palpitations, shortness of breath and dizziness. Patient has no cardiac risk factors.. Patient is low risk by Wells criteria and PERC negative.   Patient is to be discharged with recommendation to follow up with PCP in regards to today's hospital visit. Chest pain is not likely of cardiac or pulmonary etiology d/t presentation, perc negative, VSS, no tracheal deviation, no JVD or new murmur, RRR, breath sounds equal bilaterally, EKG without acute abnormalities, negative troponin, and negative CXR. Pt has been advised start a PPI and return to the ED is CP becomes exertional, associated with diaphoresis or  nausea, radiates to left jaw/arm, worsens or becomes concerning in any way. Pt appears reliable for follow up and is agreeable to discharge.   Pt is hemodynamically stable, appropriate for, and amenable to discharge at this time. Pt verbalized understanding and agrees with care plan. All questions answered. Outpatient follow-up and specific return precautions discussed.    New Prescriptions   FAMOTIDINE (PEPCID) 40 MG TABLET    Take 0.5 tablets (20 mg total) by mouth daily. OTC    Note: Portions of this report may have been transcribed using voice recognition software. Every effort was made to ensure accuracy; however, inadvertent computerized transcription  errors may be present    Wynetta Emery, PA-C 02/26/13 1845

## 2013-02-26 NOTE — ED Provider Notes (Signed)
Medical screening examination/treatment/procedure(s) were performed by non-physician practitioner and as supervising physician I was immediately available for consultation/collaboration.   Loren Racer, MD 02/26/13 2016

## 2013-03-03 ENCOUNTER — Other Ambulatory Visit (INDEPENDENT_AMBULATORY_CARE_PROVIDER_SITE_OTHER): Payer: 59

## 2013-03-03 ENCOUNTER — Encounter: Payer: Self-pay | Admitting: Physician Assistant

## 2013-03-03 ENCOUNTER — Ambulatory Visit: Payer: 59 | Admitting: Physician Assistant

## 2013-03-03 ENCOUNTER — Other Ambulatory Visit: Payer: Self-pay | Admitting: *Deleted

## 2013-03-03 ENCOUNTER — Ambulatory Visit (INDEPENDENT_AMBULATORY_CARE_PROVIDER_SITE_OTHER): Payer: 59 | Admitting: Physician Assistant

## 2013-03-03 VITALS — BP 120/70 | HR 74 | Ht 73.5 in | Wt 217.0 lb

## 2013-03-03 DIAGNOSIS — R110 Nausea: Secondary | ICD-10-CM

## 2013-03-03 DIAGNOSIS — R1013 Epigastric pain: Secondary | ICD-10-CM

## 2013-03-03 LAB — HEPATIC FUNCTION PANEL
AST: 21 U/L (ref 0–37)
Albumin: 4.6 g/dL (ref 3.5–5.2)
Total Bilirubin: 0.7 mg/dL (ref 0.3–1.2)

## 2013-03-03 LAB — LIPASE: Lipase: 15 U/L (ref 11.0–59.0)

## 2013-03-03 MED ORDER — OMEPRAZOLE 40 MG PO CPDR: DELAYED_RELEASE_CAPSULE | ORAL | Status: DC

## 2013-03-03 MED ORDER — ONDANSETRON HCL 4 MG PO TABS: ORAL_TABLET | ORAL | Status: DC

## 2013-03-03 NOTE — Progress Notes (Signed)
Subjective:    Patient ID: Antonio Rose, male    DOB: 26-Jun-1986, 27 y.o.   MRN: [id number]  HPI  Antonio Rose is a generally healthy 27 yo   male known remotely to Dr. Jarold Motto. He had undergone an upper endoscopy believe in 2007 for complaints of chest and epigastric pain which showed a small hiatal hernia and somewhat irregular Z line. Biopsies were benign and there was no evidence of Barrett's. He was seen again in January of 2013 by myself at that time with complaints of epigastric pain and burning in his chest with nausea. It was felt that he probably had a reflux esophagitis and was treated with Nexium 40 mg by mouth daily x1 month. He was to call for recurrence of symptoms. He had a rupture of his Achilles tendon earlier this year and says that he did take quite a bit of aspirin and ibuprofen earlier in the year but has not been taking any over the past couple of months. Caps comes in today stating that he is been having trouble for at least a month begin with upper abdominal discomfort and indigestion. He put himself on a 14 day course of Prilosec and says that it helped but did not resolve his symptoms. He stopped it after the 14 days and then about a week ago started having increased discomfort. He says he has some discomfort with swallowing and a feeling that there is food still sitting in his chest at times after eating. He feels is not digesting his food well has been having some nausea no vomiting and a sense of pressure in his epigastrium and left upper quadrant. He says he gets some pain at times with bending over in his lower chest as well. He is not had any fever or chills his bowel movements have been normal no melena or hematochezia. Appetite has been okay but he is down a few pounds. He went to the emergency room on 86 had labs done at that time which were unremarkable and was started on Zantac twice daily. He started himself on Nexium 20 mg by mouth daily over the past 4 days but says he  still having trouble. Says he couldn't sleep last night that he was miserable with discomfort in his epigastrium and a "pushing" sensation in his lower chest. He also continues to feel nauseated.    Review of Systems  Constitutional: Positive for appetite change.  HENT: Negative.   Eyes: Negative.   Respiratory: Negative.   Gastrointestinal: Positive for nausea and abdominal pain.  Genitourinary: Negative.   Allergic/Immunologic: Negative.   Neurological: Negative.   Hematological: Negative.   Psychiatric/Behavioral: Negative.    Outpatient Prescriptions Prior to Visit  Medication Sig Dispense Refill  . albuterol (PROVENTIL HFA;VENTOLIN HFA) 108 (90 BASE) MCG/ACT inhaler Inhale 2 puffs into the lungs every 4 (four) hours as needed for wheezing or shortness of breath.      . famotidine (PEPCID) 40 MG tablet Take 0.5 tablets (20 mg total) by mouth daily. OTC  20 tablet  0  . fluticasone (FLONASE) 50 MCG/ACT nasal spray Place 2 sprays into the nose every morning.      . montelukast (SINGULAIR) 10 MG tablet Take 10 mg by mouth at bedtime.      . Multiple Vitamin (MULTIVITAMIN WITH MINERALS) TABS tablet Take 1 tablet by mouth daily.      . ranitidine (ZANTAC) 150 MG tablet Take 150 mg by mouth 2 (two) times daily as needed for heartburn.  No facility-administered medications prior to visit.      No Known Allergies Patient Active Problem List   Diagnosis Date Noted  . Allergic rhinitis, cause unspecified 01/30/2013  . Routine general medical examination at a health care facility 08/25/2011  . Asthma 08/02/2011  . GERD (gastroesophageal reflux disease) 08/02/2011   History  Substance Use Topics  . Smoking status: Never Smoker   . Smokeless tobacco: Never Used  . Alcohol Use: No     Comment: occasionally   family history includes Asthma in his father.  Objective:   Physical Exam well-developed healthy-appearing young African American male in no acute distress, pleasant  blood pressure 120/70 pulse 74 height 6 foot 1 weight 217. HEENT; nontraumatic normocephalic EOMI PERRLA sclera anicteric, Supple; no JVD, Cardiovascula;r regular rate and rhythm with S1-S2 no murmur or gallop, Pulmonary; clear bilaterally, Abdomen; soft he is tender in the epigastrium and mildly in the left upper quadrant also along the left costal margin. Bowel sounds are present there is no succussion splash no palpable mass or hepatosplenomegaly, no guarding or rebound, Rectal ;exam not done, Extremities; no clubbing cyanosis or edema skin warm and dry, Psyc;h mood and affect normal and the        Assessment & Plan:  #60  6 -year-old [place] American male with prior history of reflux esophagitis who presents with complaints of epigastric, left upper quadrant and lower chest discomfort associated with pressure nausea and vague dysphagia. I suspect he has a recurrent reflux esophagitis and/or gastritis the #2 asthma  Plan; will switch him to Prilosec 40 mg by mouth twice daily x1 month then once daily thereafter Check hepatic panel and lipase today Schedule for upper endoscopy with Dr. Sandria Bales was discussed in detail with the patient and he is agreeable to proceed Antireflux regimen was reviewed, he was also advised to stay on a bland diet with small frequent feedings

## 2013-03-03 NOTE — Patient Instructions (Addendum)
Please go to the basement level to have your labs drawn.  We sent a prescription for Prilosec 40 mg, take 1 tab twice daily for 1 month then go to once daily. Also sent a prescription for Zofran ( Ondansetron ) 4 mg. Take as directed.  You have been scheduled for an endoscopy with propofol. Please follow written instructions given to you at your visit today. If you use inhalers (even only as needed), please bring them with you on the day of your procedure. Your physician has requested that you go to www.startemmi.com and enter the access code given to you at your visit today. This web site gives a general overview about your procedure. However, you should still follow specific instructions given to you by our office regarding your preparation for the procedure.

## 2013-03-05 ENCOUNTER — Telehealth: Payer: Self-pay | Admitting: Gastroenterology

## 2013-03-05 ENCOUNTER — Encounter: Payer: Self-pay | Admitting: Gastroenterology

## 2013-03-05 ENCOUNTER — Ambulatory Visit (AMBULATORY_SURGERY_CENTER): Payer: 59 | Admitting: Gastroenterology

## 2013-03-05 VITALS — BP 123/70 | HR 75 | Temp 98.6°F | Resp 15 | Ht 73.5 in | Wt 217.0 lb

## 2013-03-05 DIAGNOSIS — R1013 Epigastric pain: Secondary | ICD-10-CM

## 2013-03-05 DIAGNOSIS — K222 Esophageal obstruction: Secondary | ICD-10-CM

## 2013-03-05 DIAGNOSIS — K219 Gastro-esophageal reflux disease without esophagitis: Secondary | ICD-10-CM

## 2013-03-05 DIAGNOSIS — K200 Eosinophilic esophagitis: Secondary | ICD-10-CM

## 2013-03-05 MED ORDER — SODIUM CHLORIDE 0.9 % IV SOLN: 500.0000 mL | INTRAVENOUS | Status: DC

## 2013-03-05 NOTE — Op Note (Signed)
[facility] [address] [place], [address]   ENDOSCOPY PROCEDURE REPORT  PATIENT: Antonio Rose, Antonio Rose  MR#: [id number] BIRTHDATE: [birth year] , 27  yrs. old GENDER: Male ENDOSCOPIST:David Hale Bogus, MD, Clementeen Graham REFERRED BY: Mike Gip, PA-C PROCEDURE DATE:  03/05/2013 PROCEDURE:   EGD w/ biopsy and Maloney dilation of esophagus ASA CLASS:    Class II INDICATIONS: Chest pain and Dysphagia. MEDICATION: propofol (Diprivan) 350mg  IV TOPICAL ANESTHETIC:   Cetacaine Spray  DESCRIPTION OF PROCEDURE:   After the risks and benefits of the procedure were explained, informed consent was obtained.  The LB [id number]  endoscope was introduced through the mouth  and advanced to the second portion of the duodenum .  The instrument was slowly withdrawn as the mucosa was fully examined.      DUODENUM: The duodenal mucosa showed no abnormalities in the bulb and second portion of the duodenum.  STOMACH: The mucosa of the stomach appeared normal.  ESOPHAGUS: A stricture was found at the gastroesophageal junction. Multiple biopsies were performed using cold forceps.  Sample obtained for evaluation of eosinophilic esophagitis.    Retroflexed views revealed a 3 cm. hiatal hernia.    The scope was then withdrawn from the patient and the procedure completed.Dilated #10F Maloney dilator...no heme or pain noted.  COMPLICATIONS: There were no complications.   ENDOSCOPIC IMPRESSION: 1.   The duodenal mucosa showed no abnormalities in the bulb and second portion of the duodenum 2.   The mucosa of the stomach appeared normal 3.   Stricture was found at the gastroesophageal junction; multiple biopsies ...chronic GERD,r/o eosinophilic esophagitis.  RECOMMENDATIONS: 1.  Continue current medications 2.  Await pathology results    _______________________________ eSigned:  Mardella Layman, MD, [facility] 03/05/2013 1:45 PM

## 2013-03-05 NOTE — Telephone Encounter (Signed)
Left results on patient's voicemail  as requested.  

## 2013-03-05 NOTE — Patient Instructions (Addendum)
YOU HAD AN ENDOSCOPIC PROCEDURE TODAY AT THE [facility]: Refer to the procedure report that was given to you for any specific questions about what was found during the examination.  If the procedure report does not answer your questions, please call your gastroenterologist to clarify.  If you requested that your care partner not be given the details of your procedure findings, then the procedure report has been included in a sealed envelope for you to review at your convenience later.  YOU SHOULD EXPECT: Some feelings of bloating in the abdomen. Passage of more gas than usual.  Walking can help get rid of the air that was put into your GI tract .   DIET: please follow dilation diet today as instructed. Resume regular diet tomorrow, Thursday 03-06-13.  Drink plenty of fluids but you should avoid alcoholic beverages for 24 hours.  ACTIVITY: Your care partner should take you home directly after the procedure.  You should plan to take it easy, moving slowly for the rest of the day.  You can resume normal activity the day after the procedure however you should NOT DRIVE or use heavy machinery for 24 hours (because of the sedation medicines used during the test).    SYMPTOMS TO REPORT IMMEDIATELY: A gastroenterologist can be reached at any hour.  During normal business hours, 8:30 AM to 5:00 PM Monday through Friday, call [phone].  After hours and on weekends, please call the GI answering service at [phone] emergency number  who will take a message and have the physician on call contact you.   Following upper endoscopy (EGD)  Vomiting of blood or coffee ground material  New chest pain or pain under the shoulder blades  Painful or persistently difficult swallowing  New shortness of breath  Fever of 100F or higher  Black, tarry-looking stools  FOLLOW UP: If any biopsies were taken you will be contacted by phone or by letter within the next 1-3 weeks.  Call your  gastroenterologist if you have not heard about the biopsies in 3 weeks.  Our staff will call the home number listed on your records the next business day following your procedure to check on you and address any questions or concerns that you may have at that time regarding the information given to you following your procedure. This is a courtesy call and so if there is no answer at the home number and we have not heard from you through the emergency physician on call, we will assume that you have returned to your regular daily activities without incident.  SIGNATURES/CONFIDENTIALITY: You and/or your care partner have signed paperwork which will be entered into your electronic medical record.  These signatures attest to the fact that that the information above on your After Visit Summary has been reviewed and is understood.  Full responsibility of the confidentiality of this discharge information lies with you and/or your care-partner.   Dilation diet as directed.  Handouts on GERD, hiatal hernia, dilation diet, stricture.

## 2013-03-05 NOTE — Progress Notes (Signed)
Patient did not experience any of the following events: a burn prior to discharge; a fall within the facility; wrong site/side/patient/procedure/implant event; or a hospital transfer or hospital admission upon discharge from the facility. (G8907)Patient did not have preoperative order for IV antibiotic SSI prophylaxis. (G8918) ewm 

## 2013-03-05 NOTE — Progress Notes (Signed)
Called to room to assist during endoscopic procedure.  Patient ID and intended procedure confirmed with present staff. Received instructions for my participation in the procedure from the performing physician.  

## 2013-03-06 ENCOUNTER — Telehealth: Payer: Self-pay | Admitting: *Deleted

## 2013-03-06 NOTE — Telephone Encounter (Signed)
No answer, message left for the patient. 

## 2013-03-07 ENCOUNTER — Encounter: Payer: Self-pay | Admitting: Gastroenterology

## 2013-03-10 ENCOUNTER — Encounter: Payer: Self-pay | Admitting: Gastroenterology

## 2013-03-21 ENCOUNTER — Telehealth: Payer: Self-pay | Admitting: *Deleted

## 2013-03-21 ENCOUNTER — Encounter: Payer: Self-pay | Admitting: Gastroenterology

## 2013-03-21 ENCOUNTER — Other Ambulatory Visit (INDEPENDENT_AMBULATORY_CARE_PROVIDER_SITE_OTHER): Payer: 59

## 2013-03-21 DIAGNOSIS — K200 Eosinophilic esophagitis: Secondary | ICD-10-CM

## 2013-03-21 LAB — CBC WITH DIFFERENTIAL/PLATELET
Basophils Absolute: 0 10*3/uL (ref 0.0–0.1)
Eosinophils Absolute: 0.2 10*3/uL (ref 0.0–0.7)
HCT: 41.3 % (ref 39.0–52.0)
Hemoglobin: 14 g/dL (ref 13.0–17.0)
Lymphs Abs: 2.6 10*3/uL (ref 0.7–4.0)
MCHC: 33.9 g/dL (ref 30.0–36.0)
MCV: 87.9 fl (ref 78.0–100.0)
Neutro Abs: 4.3 10*3/uL (ref 1.4–7.7)
RDW: 13.7 % (ref 11.5–14.6)

## 2013-03-21 NOTE — Addendum Note (Signed)
Addended by: Florene Glen on: 03/21/2013 04:50 PM   Modules accepted: Orders

## 2013-03-21 NOTE — Telephone Encounter (Signed)
Appointment   labs now and in 1 month      Informed pt he needs a CBC now and in 1 month; I will call to remind him when the next one is due; pt stated understanding.

## 2013-04-04 ENCOUNTER — Telehealth: Payer: Self-pay | Admitting: Gastroenterology

## 2013-04-04 NOTE — Telephone Encounter (Signed)
Pt had EGD with Elease Hashimoto on 03/23/13 and was to take Prevacid BID for a month, then decrease to one daily. Pt wants to know how much longer he will be on Prevacid? Thanks.

## 2013-04-04 NOTE — Telephone Encounter (Signed)
Twice a day for 3 months in the esophagus followup

## 2013-04-11 NOTE — Telephone Encounter (Signed)
Encounter lost, pt called back today. Per Dr Norval Gable reply, he is to take the pantoprazole bid for 3 months; pt stated understanding.

## 2013-05-06 ENCOUNTER — Telehealth: Payer: Self-pay | Admitting: *Deleted

## 2013-05-06 DIAGNOSIS — K200 Eosinophilic esophagitis: Secondary | ICD-10-CM

## 2013-05-06 NOTE — Telephone Encounter (Signed)
Message copied by Florene Glen on Tue May 06, 2013  4:56 PM ------      Message from: Florene Glen      Created: Fri Mar 21, 2013  9:42 AM       Cbc in 1 month after last one ------

## 2013-05-16 NOTE — Telephone Encounter (Signed)
lmom for pt to call back. He needs to repeat a CBC per 03/21/13 note. Order is in.

## 2013-06-10 NOTE — Telephone Encounter (Signed)
Mailed pt a letter requesting he come in for a repeat CBC.

## 2013-06-25 ENCOUNTER — Other Ambulatory Visit (INDEPENDENT_AMBULATORY_CARE_PROVIDER_SITE_OTHER): Payer: 59

## 2013-06-25 DIAGNOSIS — K200 Eosinophilic esophagitis: Secondary | ICD-10-CM

## 2013-06-25 LAB — CBC WITH DIFFERENTIAL/PLATELET
Basophils Absolute: 0 10*3/uL (ref 0.0–0.1)
Eosinophils Absolute: 0.1 10*3/uL (ref 0.0–0.7)
Lymphocytes Relative: 32.9 % (ref 12.0–46.0)
Lymphs Abs: 1.7 10*3/uL (ref 0.7–4.0)
MCHC: 33.5 g/dL (ref 30.0–36.0)
Monocytes Relative: 7.4 % (ref 3.0–12.0)
Platelets: 264 10*3/uL (ref 150.0–400.0)
RDW: 13.6 % (ref 11.5–14.6)

## 2013-07-22 ENCOUNTER — Ambulatory Visit (INDEPENDENT_AMBULATORY_CARE_PROVIDER_SITE_OTHER): Payer: 59 | Admitting: Emergency Medicine

## 2013-07-22 VITALS — BP 104/50 | HR 92 | Temp 99.2°F | Resp 16 | Ht 72.25 in | Wt 216.0 lb

## 2013-07-22 DIAGNOSIS — J111 Influenza due to unidentified influenza virus with other respiratory manifestations: Secondary | ICD-10-CM

## 2013-07-22 MED ORDER — OSELTAMIVIR PHOSPHATE 75 MG PO CAPS: 75.0000 mg | ORAL_CAPSULE | Freq: Two times a day (BID) | ORAL | Status: DC

## 2013-07-22 MED ORDER — PROMETHAZINE-CODEINE 6.25-10 MG/5ML PO SYRP: 5.0000 mL | ORAL_SOLUTION | Freq: Four times a day (QID) | ORAL | Status: DC | PRN

## 2013-07-22 NOTE — Progress Notes (Signed)
[facility] [address] [place] [address] [phone]  Date:  07/22/2013   Name:  Antonio Rose   DOB:  02-21-1986   MRN:  [id number]  PCP:  Sanda Linger, MD    Chief Complaint: Illness and Abdominal Pain   History of Present Illness:  Antonio Rose is a 27 y.o. very pleasant male patient who presents with the following:  Suddenly ill two days ago with nasal congestion and drainage that is mucopurulent.  Has a post nasal drainage. Mucopurulent sputum with cough. No wheezing or shortness of breath.  No nausea or vomiting.  Has a fever 101.  No rash.  Has chronic postprandial pain in upper abdomen. Had EGD earlier this year and found a hiatal hernia.  Not on anti-acid regimen other than a prn prevacid.  Myalgias and arthralgias and malaise. Poor appetite.  No improvement with over the counter medications or other home remedies. Denies other complaint or health concern today.   Patient Active Problem List   Diagnosis Date Noted  . Allergic rhinitis, cause unspecified 01/30/2013  . Routine general medical examination at a health care facility 08/25/2011  . Asthma 08/02/2011  . GERD (gastroesophageal reflux disease) 08/02/2011    Past Medical History  Diagnosis Date  . Hiatal hernia   . GERD (gastroesophageal reflux disease)   . Anxiety     no current med.  . Seasonal allergies   . Asthma     prn inhalers  . Achilles tendon rupture 06/22/2012    left    Past Surgical History  Procedure Laterality Date  . Upper gi endoscopy      had anesthesia for procedure  . Achilles tendon surgery  06/26/2012    Procedure: ACHILLES TENDON REPAIR;  Surgeon: Sherri Rad, MD;  Location: [facility];  Service: Orthopedics;  Laterality: Left;  Left Primary Repair Achilles Tendon    History  Substance Use Topics  . Smoking status: Never Smoker   . Smokeless tobacco: Never Used  . Alcohol Use: No     Comment: occasionally    Family History   Problem Relation Age of Onset  . Asthma Father     No Known Allergies  Medication list has been reviewed and updated.  Current Outpatient Prescriptions on File Prior to Visit  Medication Sig Dispense Refill  . albuterol (PROVENTIL HFA;VENTOLIN HFA) 108 (90 BASE) MCG/ACT inhaler Inhale 2 puffs into the lungs every 4 (four) hours as needed for wheezing or shortness of breath.      . fluticasone (FLONASE) 50 MCG/ACT nasal spray Place 2 sprays into the nose every morning.      . montelukast (SINGULAIR) 10 MG tablet Take 10 mg by mouth at bedtime.      . famotidine (PEPCID) 40 MG tablet Take 0.5 tablets (20 mg total) by mouth daily. OTC  20 tablet  0  . Lansoprazole (PREVACID PO) Take 40 mg by mouth 2 (two) times daily.      . Multiple Vitamin (MULTIVITAMIN WITH MINERALS) TABS tablet Take 1 tablet by mouth daily.      . ondansetron (ZOFRAN) 4 MG tablet Take 1 tab every 6 hours as needed for nausea.  30 tablet  1  . ranitidine (ZANTAC) 150 MG tablet Take 150 mg by mouth 2 (two) times daily as needed for heartburn.        No current facility-administered medications on file prior to visit.    Review of Systems:  As per HPI, otherwise negative.    Physical Examination: Filed Vitals:   07/22/13 2035  BP: 104/50  Pulse: 92  Temp: 99.2 F (37.3 C)  Resp: 16   Filed Vitals:   07/22/13 2035  Height: 6' 0.25" (1.835 m)  Weight: 216 lb (97.977 kg)   Body mass index is 29.1 kg/(m^2). Ideal Body Weight: Weight in (lb) to have BMI = 25: 185.2  GEN: WDWN, NAD, Non-toxic, A & O x 3 HEENT: Atraumatic, Normocephalic. Neck supple. No masses, No LAD. Ears and Nose: No external deformity. CV: RRR, No M/G/R. No JVD. No thrill. No extra heart sounds. PULM: CTA B, no wheezes, crackles, rhonchi. No retractions. No resp. distress. No accessory muscle use. ABD: S, mild epigastric tenderness, ND, +BS. No rebound. No HSM. EXTR: No c/c/e NEURO Normal gait.  PSYCH: Normally interactive.  Conversant. Not depressed or anxious appearing.  Calm demeanor.    Assessment and Plan: Influenza tamiflu Phen c cod  Signed,  Phillips Odor, MD

## 2013-07-22 NOTE — Patient Instructions (Signed)

## 2013-08-07 ENCOUNTER — Encounter: Payer: Self-pay | Admitting: Gastroenterology

## 2013-08-07 ENCOUNTER — Other Ambulatory Visit (INDEPENDENT_AMBULATORY_CARE_PROVIDER_SITE_OTHER): Payer: 59

## 2013-08-07 ENCOUNTER — Ambulatory Visit (INDEPENDENT_AMBULATORY_CARE_PROVIDER_SITE_OTHER): Payer: 59 | Admitting: Gastroenterology

## 2013-08-07 VITALS — BP 130/68 | HR 68 | Ht 74.5 in | Wt 216.8 lb

## 2013-08-07 DIAGNOSIS — R109 Unspecified abdominal pain: Secondary | ICD-10-CM

## 2013-08-07 DIAGNOSIS — K219 Gastro-esophageal reflux disease without esophagitis: Secondary | ICD-10-CM

## 2013-08-07 DIAGNOSIS — K200 Eosinophilic esophagitis: Secondary | ICD-10-CM

## 2013-08-07 LAB — CBC WITH DIFFERENTIAL/PLATELET
BASOS ABS: 0 10*3/uL (ref 0.0–0.1)
Basophils Relative: 0.6 % (ref 0.0–3.0)
EOS ABS: 0.1 10*3/uL (ref 0.0–0.7)
Eosinophils Relative: 1.8 % (ref 0.0–5.0)
HEMATOCRIT: 42.6 % (ref 39.0–52.0)
Hemoglobin: 14.3 g/dL (ref 13.0–17.0)
Lymphocytes Relative: 28.7 % (ref 12.0–46.0)
Lymphs Abs: 2.1 10*3/uL (ref 0.7–4.0)
MCHC: 33.7 g/dL (ref 30.0–36.0)
MCV: 87.8 fl (ref 78.0–100.0)
MONOS PCT: 7.8 % (ref 3.0–12.0)
Monocytes Absolute: 0.6 10*3/uL (ref 0.1–1.0)
Neutro Abs: 4.5 10*3/uL (ref 1.4–7.7)
Neutrophils Relative %: 61.1 % (ref 43.0–77.0)
Platelets: 287 10*3/uL (ref 150.0–400.0)
RBC: 4.84 Mil/uL (ref 4.22–5.81)
RDW: 14 % (ref 11.5–14.6)
WBC: 7.3 10*3/uL (ref 4.5–10.5)

## 2013-08-07 MED ORDER — AMBULATORY NON FORMULARY MEDICATION: Status: AC

## 2013-08-07 NOTE — Progress Notes (Signed)
This is a 28 year old African American male who is complaining of acid reflux type symptomatology with substernal and epigastric abdominal pain in August.  He underwent endoscopy and biopsies actually showed large percentage of eosinophils suggestive of eosinophilic esophagitis he seemed to respond well to pantoprazole for several months but continues now currently with recurrence of the subxiphoid discomfort and fullness.  He denies a true hepatobiliary complaints.  He does have atopy and allergic asthma.  Last gallbladder ultrasound 7 2007.  Denies lower GI or specific hepatobiliary complaints, anorexia, weight loss, or any specific food intolerances.  He denies recurrent skin rashes, joint pains, oral stomatitis.   Current Medications, Allergies, Past Medical History, Past Surgical History, Family History and Social History were reviewed in [facility] electronic medical record.  ROS: All systems were reviewed and are negative unless otherwise stated in the HPI.          Physical Exam: Blood pressure 130/68, pulse 60 and regular and weight 216 pounds the BMI of 27.47.  Chest clear his cardiac exam is unremarkable.  I cannot appreciate hepatosplenomegaly, abdominal masses or tenderness.  Bowel sounds are normal.  ET exam shows no specific abnormalities.  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  I have decided restart daily PPI therapy and also we'll use a wall nonabsorbable steroid spray 3 times a day see if this has an affect on his clinical course.  We also will perform ultrasound to exclude cholelithiasis.  He was see Dr. Rhea BeltonPyrtle followup in 6 weeks' time.  Depending on his clinical course, he may benefit from repeat endoscopy and repeat esophageal biopsies.  I have ordered an absolute eosinophil count on his blood work.  and a CBC.  He may need further referral to  allergy for elminatiomn diet trials.  For now, we will do above therapy with office followup as scheduled so that Dr. Rhea BeltonPyrtle canget to know the patient, and help sort out whether or not he thinks he has true eosinophilic esophagitis versus eosinophilic infiltration secondary to GERD.  High-resolution esophageal manometry may also be indicated.  Movie concerning medical management of acid reflux shown to patient today for his edification.

## 2013-08-07 NOTE — Patient Instructions (Addendum)
You have been scheduled for an abdominal [facility] of hospital) on 08-13-2013 at 9 am. Please arrive 15 minutes prior to your appointment for registration. Make certain not to have anything to eat or drink 6 hours prior to your appointment. Should you need to reschedule your appointment, please contact radiology at [phone]. This test typically takes about 30 minutes to perform.  Your physician has requested that you go to the basement for the following lab work before leaving today: CBC Eosinophil Count   We have sent the following medications to [facility] for you to pick up at your convenience:(PHARMACY WILL CALL YOU WHEN PRESCRIPTION IS READY) Budesonide 2 mg/10 ml, three times daily as directed   Stay on Prevacid twice daily   Follow up with Dr. Rhea BeltonPyrtle in three months  Information on acid reflux is below for your review __________________________________________________________________________________________________________________  Diet for Gastroesophageal Reflux Disease, Adult Reflux (acid reflux) is when acid from your stomach flows up into the esophagus. When acid comes in contact with the esophagus, the acid causes irritation and soreness (inflammation) in the esophagus. When reflux happens often or so severely that it causes damage to the esophagus, it is called gastroesophageal reflux disease (GERD). Nutrition therapy can help ease the discomfort of GERD. FOODS OR DRINKS TO AVOID OR LIMIT  Smoking or chewing tobacco. Nicotine is one of the most potent stimulants to acid production in the gastrointestinal tract.  Caffeinated and decaffeinated coffee and black tea.  Regular or low-calorie carbonated beverages or energy drinks (caffeine-free carbonated beverages are allowed).   Strong spices, such as black pepper, white pepper, red pepper, cayenne, curry powder, and chili powder.  Peppermint or  spearmint.  Chocolate.  High-fat foods, including meats and fried foods. Extra added fats including oils, butter, salad dressings, and nuts. Limit these to less than 8 tsp per day.  Fruits and vegetables if they are not tolerated, such as citrus fruits or tomatoes.  Alcohol.  Any food that seems to aggravate your condition. If you have questions regarding your diet, call your caregiver or a registered dietitian. OTHER THINGS THAT MAY HELP GERD INCLUDE:   Eating your meals slowly, in a relaxed setting.  Eating 5 to 6 small meals per day instead of 3 large meals.  Eliminating food for a period of time if it causes distress.  Not lying down until 3 hours after eating a meal.  Keeping the head of your bed raised 6 to 9 inches (15 to 23 cm) by using a foam wedge or blocks under the legs of the bed. Lying flat may make symptoms worse.  Being physically active. Weight loss may be helpful in reducing reflux in overweight or obese adults.  Wear loose fitting clothing EXAMPLE MEAL PLAN This meal plan is approximately 2,000 calories based on https://www.[place].org/ChooseMyPlate.gov meal planning guidelines. Breakfast   cup cooked oatmeal.  1 cup strawberries.  1 cup low-fat milk.  1 oz almonds. Snack  1 cup cucumber slices.  6 oz yogurt (made from low-fat or fat-free milk). Lunch  2 slice whole-wheat bread.  2 oz sliced [place]key.  2 tsp mayonnaise.  1 cup blueberries.  1 cup snap peas. Snack  6 whole-wheat crackers.  1 oz string cheese. Dinner   cup brown rice.  1 cup mixed veggies.  1 tsp olive oil.  3 oz grilled fish. Document Released: 07/10/2005 Document Revised: 10/02/2011 Document Reviewed: 05/26/2011 [facility] Patient Information 2014 [place] [place].

## 2013-08-08 LAB — EOSINOPHIL COUNT: EOS ABS: 0.1 10*3/uL (ref 0.0–0.7)

## 2013-08-13 ENCOUNTER — Ambulatory Visit (HOSPITAL_COMMUNITY)
Admission: RE | Admit: 2013-08-13 | Discharge: 2013-08-13 | Disposition: A | Discharge status: Home / Self Care | Payer: 59 | Source: Ambulatory Visit | Attending: Gastroenterology | Admitting: Gastroenterology

## 2013-08-13 DIAGNOSIS — R109 Unspecified abdominal pain: Secondary | ICD-10-CM | POA: Insufficient documentation

## 2013-08-29 ENCOUNTER — Other Ambulatory Visit: Payer: Self-pay | Admitting: Physician Assistant

## 2014-04-09 ENCOUNTER — Other Ambulatory Visit: Payer: Self-pay | Admitting: Internal Medicine

## 2014-04-22 ENCOUNTER — Other Ambulatory Visit: Payer: Self-pay | Admitting: Physician Assistant

## 2014-04-22 ENCOUNTER — Telehealth: Payer: Self-pay | Admitting: *Deleted

## 2014-04-22 NOTE — Telephone Encounter (Signed)
I called the patient and LM for him  [facility] PA-C , we refilled this until 07-24-2014. He can call our office in Jan 2016 to choose another MD and make an appoinment for further refills.

## 2014-10-07 ENCOUNTER — Other Ambulatory Visit: Payer: Self-pay | Admitting: Internal Medicine

## 2014-12-18 ENCOUNTER — Telehealth: Payer: Self-pay | Admitting: Internal Medicine

## 2014-12-18 NOTE — Telephone Encounter (Signed)
Informed patient on vm °

## 2014-12-18 NOTE — Telephone Encounter (Signed)
Patient is requesting refill for VENTOLIN HFA 108 (90 BASE) MCG/ACT inhaler [[id number]][[id number]] and montelukast (SINGULAIR) 10 MG tablet [[id number]. [facility]

## 2015-04-21 ENCOUNTER — Telehealth: Payer: Self-pay | Admitting: Internal Medicine

## 2015-04-21 NOTE — Telephone Encounter (Signed)
Patient called and has a new employer and they need a form filled out of his biometric screening and faxed back to them by 9/30. Pt will be faxing form to [place].

## 2015-04-22 NOTE — Telephone Encounter (Signed)
Have not received form as of 8:51 9/29.

## 2015-04-22 NOTE — Telephone Encounter (Signed)
Pt has not been seen since 2014. Dr. Yetta Barre declined. Pt informed

## 2015-05-12 IMAGING — US US ABDOMEN COMPLETE
1 series · 14 of 25 positions shown · non-contrast
Comparison: None.

CLINICAL DATA: Abdominal pain.

EXAM:
ULTRASOUND ABDOMEN COMPLETE

[Series 1: us abdomen complete · 0.27mm/px · 14 of 86 slices shown]
[im 1/86]
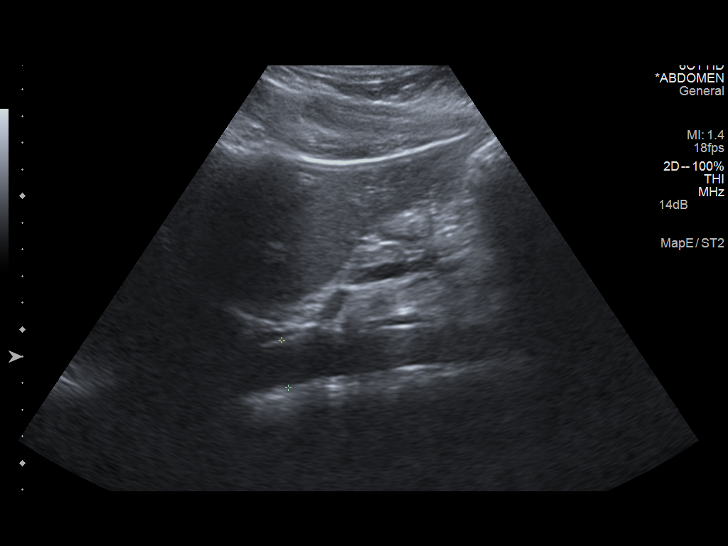
[im 8/86]
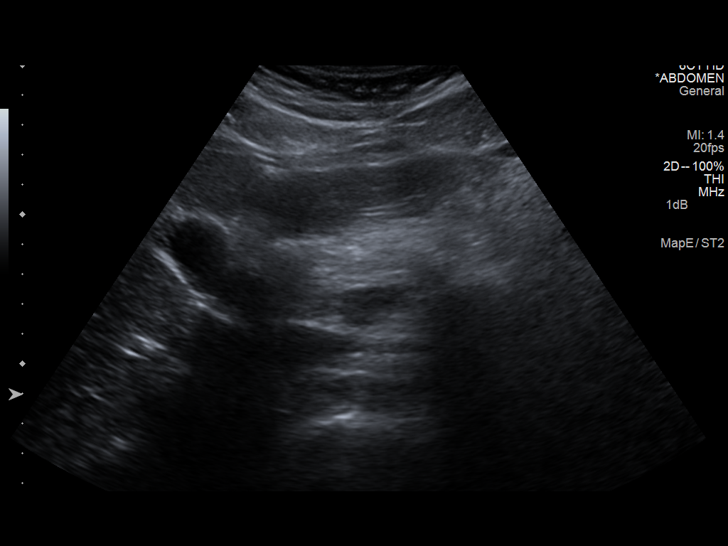
[im 15/86]
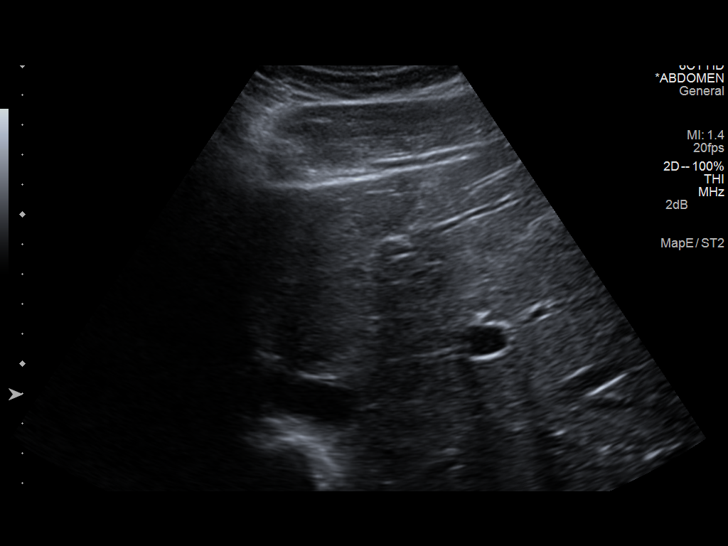
[im 22/86]
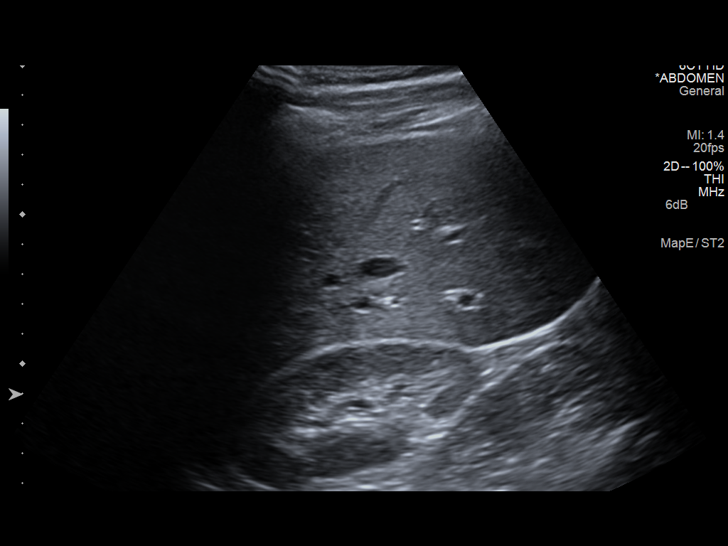
[im 29/86]
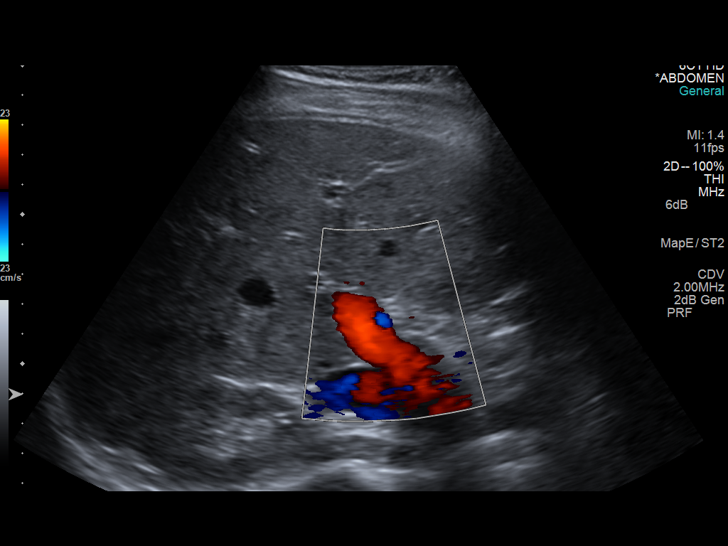
[im 32/86]
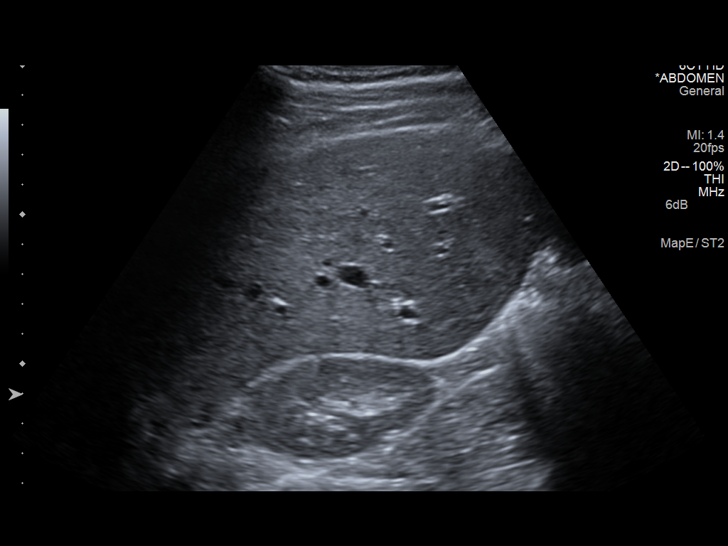
[im 39/86]
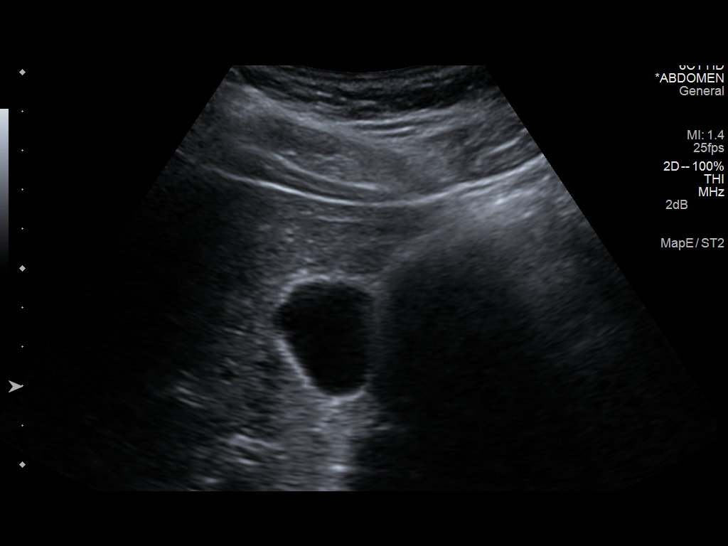
[im 47/86]
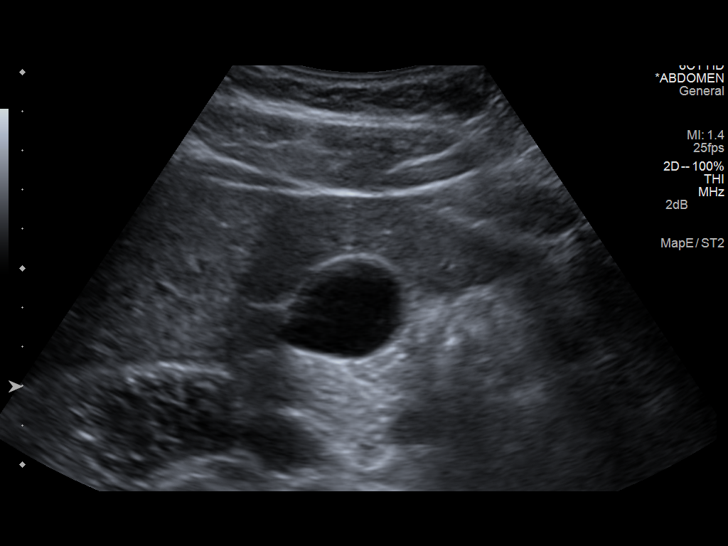
[im 54/86]
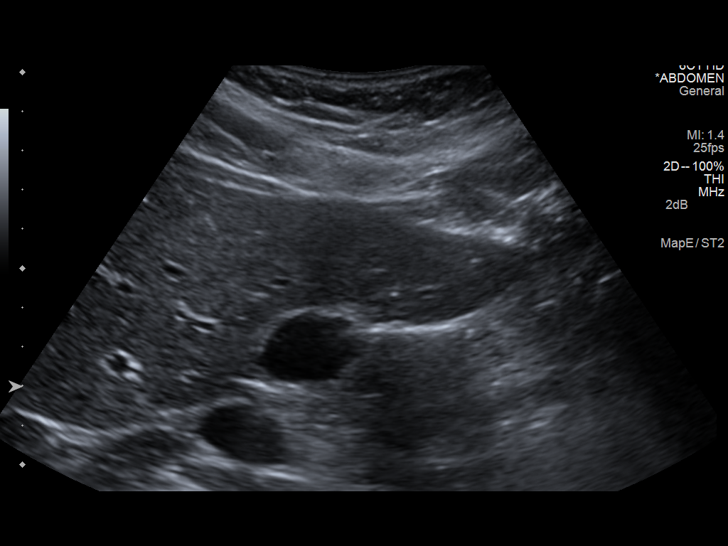
[im 57/86]
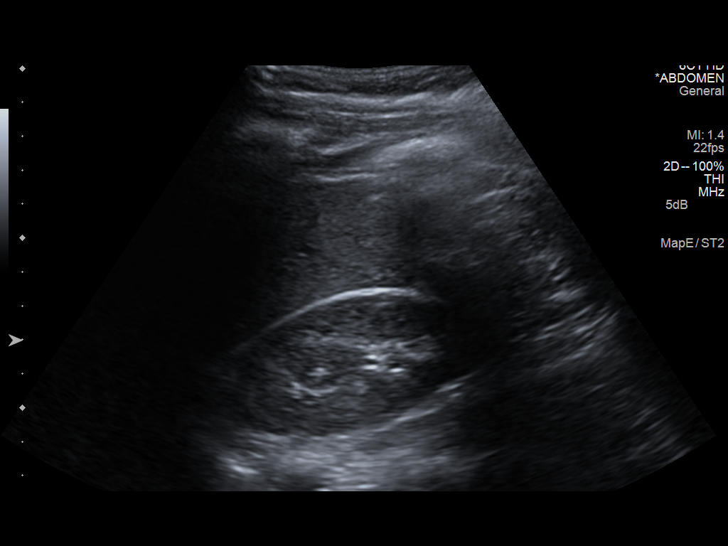
[im 64/86]
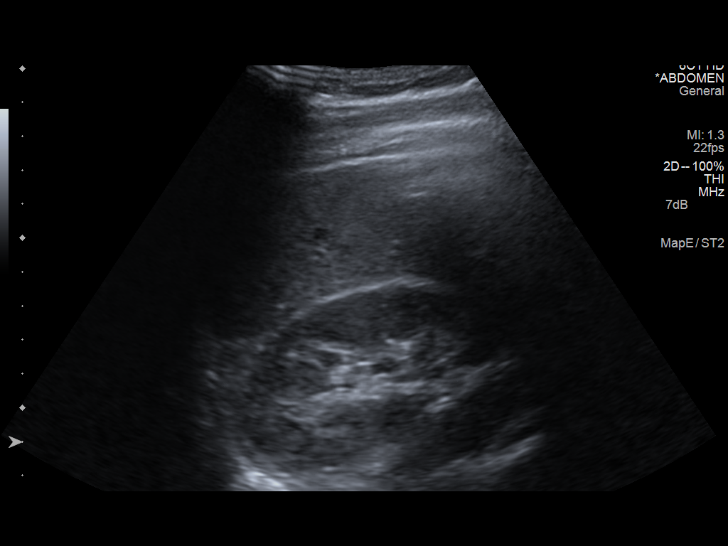
[im 71/86]
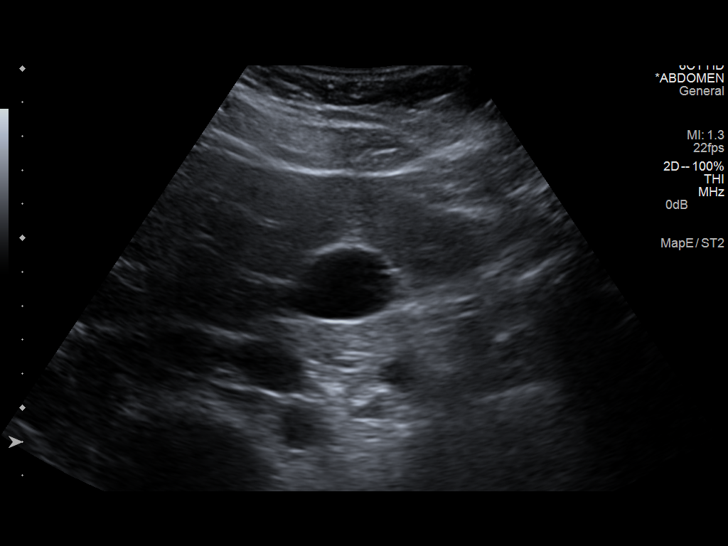
[im 78/86]
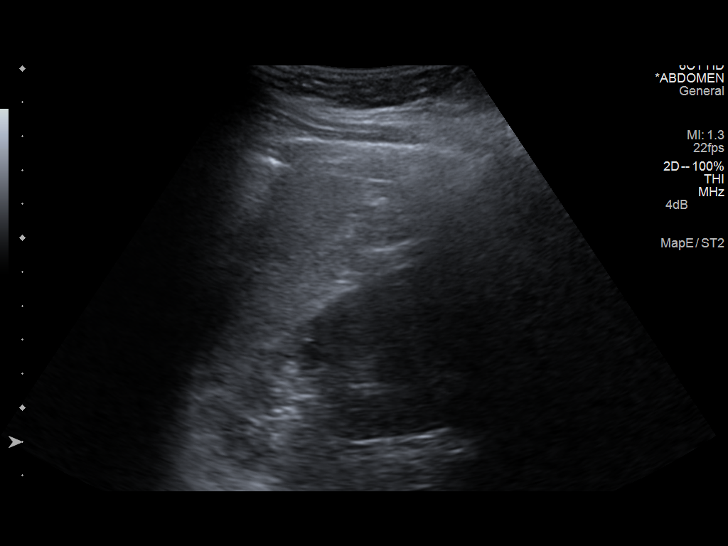
[im 86/86]
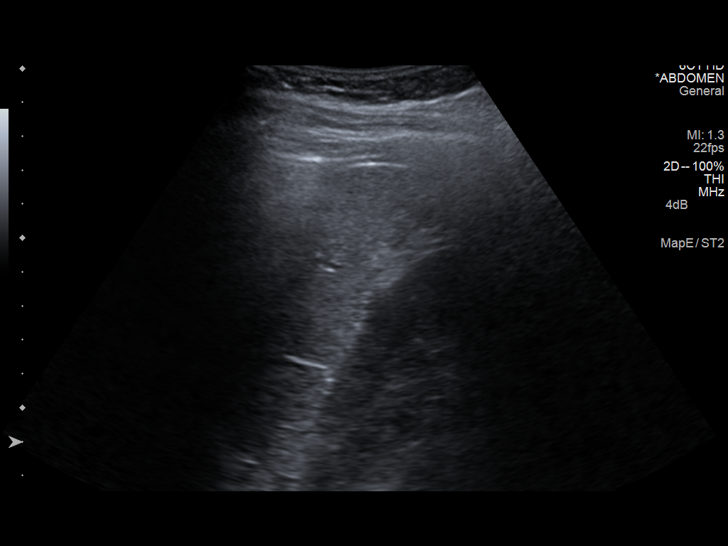

[14 of 25 positions shown; findings below may reference images not displayed]

FINDINGS: Gallbladder:

No gallstones or wall thickening visualized. No sonographic Murphy
sign noted.

Common bile duct:

Diameter: 2.6 cm

Liver:

No focal lesion identified. Within normal limits in parenchymal
echogenicity.

IVC:

No abnormality visualized.

Pancreas:

Visualized portion unremarkable.

Spleen:

Size and appearance within normal limits.

Right Kidney:

Length: 10.6 cm. Echogenicity within normal limits. No mass or
hydronephrosis visualized.

Left Kidney:

Length: 11.8 cm. Echogenicity within normal limits. No mass or
hydronephrosis visualized.

Abdominal aorta:

No aneurysm visualized.

Other findings:

None.
IMPRESSION: Negative exam.

## 2015-09-03 ENCOUNTER — Encounter: Payer: Self-pay | Admitting: Gastroenterology
